# Patient Record
Sex: Male | Born: 1985 | Race: White | Hispanic: No | Smoking: Former smoker
Health system: Southern US, Community
[De-identification: ages and names within clinical notes are randomized; demographics above are authoritative.]

## PROBLEM LIST (undated history)

## (undated) DIAGNOSIS — F419 Anxiety disorder, unspecified: Secondary | ICD-10-CM

## (undated) DIAGNOSIS — B019 Varicella without complication: Secondary | ICD-10-CM

## (undated) DIAGNOSIS — F32A Depression, unspecified: Secondary | ICD-10-CM

## (undated) DIAGNOSIS — K859 Acute pancreatitis without necrosis or infection, unspecified: Secondary | ICD-10-CM

## (undated) DIAGNOSIS — I10 Essential (primary) hypertension: Secondary | ICD-10-CM

## (undated) DIAGNOSIS — F1920 Other psychoactive substance dependence, uncomplicated: Secondary | ICD-10-CM

## (undated) DIAGNOSIS — I639 Cerebral infarction, unspecified: Secondary | ICD-10-CM

## (undated) DIAGNOSIS — F329 Major depressive disorder, single episode, unspecified: Secondary | ICD-10-CM

## (undated) HISTORY — DX: Major depressive disorder, single episode, unspecified: F32.9

## (undated) HISTORY — DX: Anxiety disorder, unspecified: F41.9

## (undated) HISTORY — DX: Cerebral infarction, unspecified: I63.9

## (undated) HISTORY — DX: Other psychoactive substance dependence, uncomplicated: F19.20

## (undated) HISTORY — DX: Acute pancreatitis without necrosis or infection, unspecified: K85.90

## (undated) HISTORY — DX: Essential (primary) hypertension: I10

## (undated) HISTORY — DX: Depression, unspecified: F32.A

## (undated) HISTORY — DX: Varicella without complication: B01.9

---

## 2007-12-04 HISTORY — PX: TONSILLECTOMY: SUR1361

## 2012-02-19 ENCOUNTER — Other Ambulatory Visit: Payer: Self-pay

## 2012-02-19 ENCOUNTER — Encounter (HOSPITAL_COMMUNITY): Payer: Self-pay | Admitting: Emergency Medicine

## 2012-02-19 ENCOUNTER — Encounter (HOSPITAL_COMMUNITY): Payer: Self-pay | Admitting: *Deleted

## 2012-02-19 ENCOUNTER — Emergency Department (HOSPITAL_COMMUNITY)
Admission: EM | Admit: 2012-02-19 | Discharge: 2012-02-19 | Disposition: A | Discharge status: Home / Self Care | Payer: BC Managed Care – PPO | Attending: Emergency Medicine | Admitting: Emergency Medicine

## 2012-02-19 DIAGNOSIS — R631 Polydipsia: Secondary | ICD-10-CM | POA: Insufficient documentation

## 2012-02-19 DIAGNOSIS — R42 Dizziness and giddiness: Secondary | ICD-10-CM

## 2012-02-19 DIAGNOSIS — R209 Unspecified disturbances of skin sensation: Secondary | ICD-10-CM | POA: Insufficient documentation

## 2012-02-19 DIAGNOSIS — R3589 Other polyuria: Secondary | ICD-10-CM | POA: Insufficient documentation

## 2012-02-19 DIAGNOSIS — R55 Syncope and collapse: Secondary | ICD-10-CM | POA: Insufficient documentation

## 2012-02-19 DIAGNOSIS — R002 Palpitations: Secondary | ICD-10-CM

## 2012-02-19 DIAGNOSIS — R110 Nausea: Secondary | ICD-10-CM | POA: Insufficient documentation

## 2012-02-19 DIAGNOSIS — R5383 Other fatigue: Secondary | ICD-10-CM | POA: Insufficient documentation

## 2012-02-19 DIAGNOSIS — R358 Other polyuria: Secondary | ICD-10-CM | POA: Insufficient documentation

## 2012-02-19 DIAGNOSIS — H538 Other visual disturbances: Secondary | ICD-10-CM | POA: Insufficient documentation

## 2012-02-19 DIAGNOSIS — R5381 Other malaise: Secondary | ICD-10-CM | POA: Insufficient documentation

## 2012-02-19 DIAGNOSIS — R61 Generalized hyperhidrosis: Secondary | ICD-10-CM | POA: Insufficient documentation

## 2012-02-19 LAB — URINE MICROSCOPIC-ADD ON

## 2012-02-19 LAB — CBC
Hemoglobin: 15.8 g/dL (ref 13.0–17.0)
MCHC: 35.4 g/dL (ref 30.0–36.0)
RBC: 5.3 MIL/uL (ref 4.22–5.81)
WBC: 8.5 10*3/uL (ref 4.0–10.5)

## 2012-02-19 LAB — DIFFERENTIAL
Basophils Relative: 0 % (ref 0–1)
Monocytes Relative: 6 % (ref 3–12)
Neutro Abs: 5.9 10*3/uL (ref 1.7–7.7)
Neutrophils Relative %: 70 % (ref 43–77)

## 2012-02-19 LAB — BASIC METABOLIC PANEL
Chloride: 103 mEq/L (ref 96–112)
GFR calc Af Amer: >90 mL/min (ref >90)
Potassium: 4.2 mEq/L (ref 3.5–5.1)

## 2012-02-19 LAB — RAPID URINE DRUG SCREEN, HOSP PERFORMED
Barbiturates: NOT DETECTED
Benzodiazepines: NOT DETECTED
Cocaine: NOT DETECTED

## 2012-02-19 LAB — URINALYSIS, ROUTINE W REFLEX MICROSCOPIC
Bilirubin Urine: NEGATIVE
Glucose, UA: NEGATIVE mg/dL
Ketones, ur: NEGATIVE mg/dL
Nitrite: NEGATIVE
pH: 7 (ref 5.0–8.0)

## 2012-02-19 LAB — GLUCOSE, CAPILLARY: Glucose-Capillary: 96 mg/dL (ref 70–99)

## 2012-02-19 NOTE — ED Notes (Signed)
MD at bedside. 

## 2012-02-19 NOTE — ED Notes (Signed)
had a kinda syncopal episode on Friday  Got cold and sweaty dizzy, has had this  Happen again so he is here for tx has been really thirsty

## 2012-02-19 NOTE — Discharge Instructions (Signed)
RESOURCE GUIDE  Dental Problems  Patients with Medicaid: [facility]                     [facility] [address]                                           [address] Phone:  [phone]                                                  Phone:  [phone]  If unable to pay or uninsured, contact:  [facility] or [facility] to become qualified for the adult dental clinic.  Chronic Pain Problems Contact [facility]  [phone] Patients need to be referred by their primary care doctor.  Insufficient Money for Medicine Contact [employer]:  call "211" or [facility] 271-5999.  No Primary Care Doctor Call [facility]  832-8000 Other agencies that provide inexpensive medical care    [facility]  [phone]    [facility]  [phone]    [facility]  [phone]    Women[facility] Clinic  [phone]    [facility]  [phone]    [facility]  [phone]  Psychological Services [facility]  [phone] [facility]  [phone] [facility]   [phone] (emergency services 641-4993)  Substance Abuse Resources [facility]  336-882-2125 [facility] 336-784-9470 [facility] 336-285-9073 [facility] 336-845-3988 Residential & Outpatient Substance Abuse Program  800-659-3381  Abuse/Neglect [place] Child Abuse Hotline (336) 641-3795 [place] [facility] Abuse Hotline 800-378-5315 (After Hours)  Emergency Shelter [facility] (336) 271-5985  M[facility] Homes [facility] (336) 275-9566 [facility] (704) 372-4663  MRSA Hotline #:   832-7006    [place] [facility]  [facility] [facility]     [employer]                          [facility] [address] [place]                       [address]      [address]  [place]                                                [place]                            [place] Phone:  [phone]                                   Phone:  [phone]                 Phone:  [phone]  [facility] Phone:  [phone]    [facility] Child Abuse Hotline (203)856-8190 (580)471-4542 (After Hours)     Anxiety and Panic Attacks Your caregiver has informed you that you are having an anxiety or panic attack. There may be many forms of this. Most of the time these attacks come suddenly and without warning. They come at any time of day, including periods of sleep, and at any time of life. They may be strong and unexplained. Although panic attacks are very scary, they are physically harmless. Sometimes the cause of your anxiety is not known. Anxiety is a protective mechanism of the body in its fight or flight mechanism. Most of these perceived danger situations are actually nonphysical situations (such as anxiety over losing a job). CAUSES  The causes of an anxiety or panic attack are many. Panic attacks may occur in otherwise healthy people given a certain set of circumstances. There may be a genetic cause for panic attacks. Some medications may also have anxiety as a side effect. SYMPTOMS  Some of the most common feelings are:  Intense terror.   Dizziness, feeling faint.   Hot and cold flashes.   Fear of going crazy.   Feelings that nothing is real.   Sweating.   Shaking.   Chest pain or a fast heartbeat (palpitations).   Smothering, choking sensations.   Feelings of impending doom and that death is near.   Tingling of extremities, this may be from over-breathing.   Altered reality (derealization).   Being detached from yourself (depersonalization).  Several symptoms can be present to make up anxiety or panic attacks. DIAGNOSIS  The evaluation by your caregiver will depend on the type of symptoms you are  experiencing. The diagnosis of anxiety or panic attack is made when no physical illness can be determined to be a cause of the symptoms. TREATMENT  Treatment to prevent anxiety and panic attacks may include:  Avoidance of circumstances that cause anxiety.   Reassurance and relaxation.   Regular exercise.   Relaxation therapies, such as yoga.   Psychotherapy with a psychiatrist or therapist.   Avoidance of caffeine, alcohol and illegal drugs.   Prescribed medication.  SEEK IMMEDIATE MEDICAL CARE IF:   You experience panic attack symptoms that are different than your usual symptoms.   You have any worsening or concerning symptoms.  Document Released: 11/19/2005 Document Revised: 11/08/2011 Document Reviewed: 03/23/2010 [facility] Patient Information 2012 [place], [place].

## 2012-02-19 NOTE — ED Provider Notes (Signed)
History     CSN: [id number]  Arrival date & time 02/19/12  4098   First MD Initiated Contact with Patient 02/19/12 [phone]      Chief Complaint  Patient presents with  . Weakness    (Consider location/radiation/quality/duration/timing/severity/associated sxs/prior treatment) HPI The patient is a 26 yo man, no significant past medical history, presenting with episodes of dizziness, LH, and palpitations.  Four days ago, the patient was at work when he experienced an episode of LH, dizziness, palpitations, presyncope, blurry vision, and a cold sweat.  The symptoms resolved after a few hours at rest.  The patient had similar episodes 1 day ago and this morning, prompting him to seek care.  The patient also notes significant polyuria/polydipsia for the last week, feeling constantly thirsty, drinking > 4L of water during the day and urinating frequently.  He notes no dysuria or suprapubic pain.  He notes some nausea, but no vomiting, abd pain, diarrhea, or constipation.  He believes he may have felt similar symptoms once a couple of years ago.  History reviewed. No pertinent past medical history.  Past Surgical History  Procedure Date  . Tonsillectomy     No family history on file.  History  Substance Use Topics  . Smoking status: Current Everyday Smoker  . Smokeless tobacco: Not on file  . Alcohol Use: Yes      Review of Systems General: no fevers, chills, changes in weight, changes in appetite Skin: no rash HEENT: no hearing changes, sore throat Pulm: no dyspnea, coughing, wheezing CV: no chest pain, shortness of breath Abd: no abdominal pain, vomiting, diarrhea/constipation GU: no dysuria, hematuria Ext: no arthralgias, myalgias Neuro: +mild whole-body "tingling" during episodes  Allergies  Penicillins  Home Medications  No current outpatient prescriptions on file.  BP 148/92  Pulse 82  Temp(Src) 97.5 F (36.4 C) (Oral)  Resp 18  SpO2 98%  Physical Exam General:  alert, cooperative, appears mildly anxious HEENT: pupils equal round and reactive to light, EOMI, visual acuity 20/30 bilaterally, visual fields intact, dix-halpike maneuver produced minimal dizziness and no nystagmus, oropharynx clear and non-erythematous  Neck: supple, no lymphadenopathy, no JVD Lungs: clear to ascultation bilaterally, normal work of respiration, no wheezes, rales, ronchi Heart: regular rate and rhythm, no murmurs, gallops, or rubs Abdomen: soft, non-tender, non-distended, normal bowel sounds Extremities: no cyanosis, clubbing, or edema Neurologic: alert & oriented X3, cranial nerves II-XII intact, strength grossly intact, sensation intact to light touch  ED Course  Procedures (including critical care time)   Labs Reviewed  GLUCOSE, CAPILLARY  CBC  DIFFERENTIAL  URINE RAPID DRUG SCREEN (HOSP PERFORMED)  URINALYSIS, ROUTINE W REFLEX MICROSCOPIC  BASIC METABOLIC PANEL   No results found.   No diagnosis found.  EKG: NSR, no ST abnormalities, some J-point elevation in V3-V6  MDM  The patient is a 26 yo man, presenting with 3 episodes of LH, dizziness, palpitations, and blurry vision, also with polyuria/polydipsia.  # Palpitations/LH/blurry vision - symptoms may represent paroxysmal afib, though patient is currently in NSR.  Differential also includes hypoglycemia vs hyponatremia vs vertigo (though no nystagmus, only mild dizziness with dix-halpike) vs toxin-induced vs infection vs panic attack. -cbc, cmp -UA, urine tox screen -EKG normal  # Polyuria - unclear etiology, consider primary polydipsia vs diabetes insipidus.  DM unlikely given fasting blood glucose = 96. -cmp, UA -likely warrants further w/u by PCP  # Dispo - pending labs, likely d/c home with PCP follow-up   Linward Headland, MD 02/19/12  1018   12:22 pm - patient feels somewhat better.  Labs showed no anemia, leukocytosis, or electrolyte abnormality.  Urine tox screen negative.  Symptoms likely  represent paroxysmal atrial fibrillation vs panic attack.  Recommended close f/u with a PCP, and a list of PCP's were provided to patient.  Will discharge to home.  Linward Headland, MD 02/19/12 [phone]

## 2012-02-19 NOTE — ED Provider Notes (Signed)
I saw and evaluated the patient, reviewed the resident's note and I agree with the findings and plan.    Date: 02/19/2012  Rate: 69  Rhythm: normal sinus rhythm  QRS Axis: normal  Intervals: normal  ST/T Wave abnormalities: normal  Conduction Disutrbances:none  Narrative Interpretation:   Old EKG Reviewed: none available.   25yo M, c/o several near syncopal episodes of the past several days.  Has hx of same several years ago but unsure what dx was.  Describes as "dizzy," lightheaded, palpitations, "cold sweat."  Seem to occur when he is at work.  VSS, not orthostatic, CTA, RRR, neuro non-focal.  EKG, labs normal.  CBG also normal.  Pt reassured, D/C and f/u with PMD.        Laray Anger, DO 02/21/12 1323

## 2012-02-21 ENCOUNTER — Other Ambulatory Visit: Payer: Self-pay | Admitting: Family Medicine

## 2012-02-21 ENCOUNTER — Ambulatory Visit
Admission: RE | Admit: 2012-02-21 | Discharge: 2012-02-21 | Disposition: A | Discharge status: Home / Self Care | Payer: BC Managed Care – PPO | Source: Ambulatory Visit | Attending: Family Medicine | Admitting: Family Medicine

## 2012-02-21 DIAGNOSIS — R05 Cough: Secondary | ICD-10-CM

## 2015-12-23 ENCOUNTER — Ambulatory Visit (INDEPENDENT_AMBULATORY_CARE_PROVIDER_SITE_OTHER): Payer: BLUE CROSS/BLUE SHIELD | Admitting: Adult Health

## 2015-12-23 ENCOUNTER — Encounter: Payer: Self-pay | Admitting: Adult Health

## 2015-12-23 VITALS — BP 138/100 | Temp 99.0°F | Ht 73.0 in | Wt 215.0 lb

## 2015-12-23 DIAGNOSIS — Z7689 Persons encountering health services in other specified circumstances: Secondary | ICD-10-CM

## 2015-12-23 DIAGNOSIS — I10 Essential (primary) hypertension: Secondary | ICD-10-CM | POA: Insufficient documentation

## 2015-12-23 DIAGNOSIS — Z7189 Other specified counseling: Secondary | ICD-10-CM

## 2015-12-23 NOTE — Progress Notes (Signed)
HPI:  Antonio Rogers is here to establish care. He is a very pleasant caucasian male who  has a past medical history of Drug abuse and dependence ([place]); Chicken pox; Depression; Hypertension; and Anxiety.  Last PCP and physical: 10 years ago  Has the following chronic problems that require follow up and concerns today:  Hypertension - He feels as though his blood pressure elevation is more the result of anxiety. He has noticed that once his anxiety is under control, his blood pressures come down.   Tobacco Use - He smokes about a pack a day. His brother ( 43 years old) recently died from a drug overdose. He knows he needs to quit but does not want to at this time.   ROS negative for unless reported above: fevers, chills,feeling poorly, unintentional weight loss, hearing or vision loss, chest pain, palpitations, leg claudication, struggling to breath,Not feeling congested in the chest, no orthopenia, no cough,no wheezing, normal appetite, no soft tissue swelling, no hemoptysis, melena, hematochezia, hematuria, falls, loc, si, or thoughts of self harm.  Immunizations: Diet: Tries to eat healthy  Exercise: Uses treadmill 30 minutes a day 3-4 days a week.   He follows up with psychiatry - Sees them once every other month.    Past Medical History  Diagnosis Date  . Drug abuse and dependence ([place])     cocaine   . Chicken pox   . Depression   . Hypertension     high blood pressure readings  . Anxiety     Past Surgical History  Procedure Laterality Date  . Tonsillectomy  2009    History reviewed. No pertinent family history.  Social History   Social History  . Marital Status: Married    Spouse Name: N/A  . Number of Children: N/A  . Years of Education: N/A   Social History Main Topics  . Smoking status: Current Every Day Smoker -- 1.00 packs/day    Types: Cigarettes  . Smokeless tobacco: None  . Alcohol Use: 0.0 oz/week    0 Standard drinks or equivalent per week     Comment: daily; 1 to 2 glasses of wine a night   . Drug Use: None  . Sexual Activity: Not Asked   Other Topics Concern  . None   Social History Narrative     Current outpatient prescriptions:  .  hydrOXYzine (ATARAX/VISTARIL) 50 MG tablet, TAKE 1/2-1 TABLET BY MOUTH EVERY 6 HOURS AS NEEDED FOR ANXIETY, Disp: , Rfl: 2 .  traZODone (DESYREL) 50 MG tablet, Take 150 mg by mouth at bedtime. , Disp: , Rfl: 0 .  venlafaxine XR (EFFEXOR-XR) 37.5 MG 24 hr capsule, TAKE 1 CAPSULE BY MOUTH DAILY FOR MOOD., Disp: , Rfl: 0  EXAM:  Filed Vitals:   12/23/15 1305  BP: 138/100  Temp: 99 F (37.2 C)    Body mass index is 28.37 kg/(m^2).  GENERAL: vitals reviewed and listed above, alert, oriented, appears well hydrated and in no acute distress  HEENT: atraumatic, conjunttiva clear, no obvious abnormalities on inspection of external nose and ears  NECK: Neck is soft and supple without masses, no adenopathy or thyromegaly, trachea midline, no JVD. Normal range of motion.   LUNGS: clear to auscultation bilaterally, no wheezes, rales or rhonchi, good air movement  CV: Regular rate and rhythm, normal S1/S2, no audible murmurs, gallops, or rubs. No carotid bruit and no peripheral edema.   MS: moves all extremities without noticeable abnormality. No edema noted  Abd: soft/nontender/nondistended/normal  bowel sounds   Skin: warm and dry, no rash   Extremities: No clubbing, cyanosis, or edema. Capillary refill is WNL. Pulses intact bilaterally in upper and lower extremities.   Neuro: CN II-XII intact, sensation and reflexes normal throughout, 5/5 muscle strength in bilateral upper and lower extremities. Normal finger to nose. Normal rapid alternating movements. Normal romberg. No pronator drift.   PSYCH: pleasant and cooperative, no obvious depression or anxiety  ASSESSMENT AND PLAN:  1. Essential hypertension - Work on diet and exercise. We will explore this more at his physical. He does  not want to start any medication at this time. Would like to get his anxiety under control and work on diet and exercise to see if it reduces.   2. Encounter to establish care - Follow up for CPE - Follow up sooner if needed - Work on diet and exercise - Quit smoking   -We reviewed the PMH, PSH, FH, SH, Meds and Allergies. -We provided refills for any medications we will prescribe as needed. -We addressed current concerns per orders and patient instructions. -We have asked for records for pertinent exams, studies, vaccines and notes from previous providers. -We have advised patient to follow up per instructions below.   -Patient advised to return or notify a provider immediately if symptoms worsen or persist or new concerns arise.    Dorothyann Peng, AGNP

## 2015-12-23 NOTE — Patient Instructions (Addendum)
It was great meeting you today!  Please follow up with me for your physical. If you need anything in the meantime, please let me know.   Start working on cutting down on the amount of cigarettes you smoke. Start exercising and eating healthy.   Health Maintenance, Male A healthy lifestyle and preventative care can promote health and wellness.  Maintain regular health, dental, and eye exams.  Eat a healthy diet. Foods like vegetables, fruits, whole grains, low-fat dairy products, and lean protein foods contain the nutrients you need and are low in calories. Decrease your intake of foods high in solid fats, added sugars, and salt. Get information about a proper diet from your health care provider, if necessary.  Regular physical exercise is one of the most important things you can do for your health. Most adults should get at least 150 minutes of moderate-intensity exercise (any activity that increases your heart rate and causes you to sweat) each week. In addition, most adults need muscle-strengthening exercises on 2 or more days a week.   Maintain a healthy weight. The body mass index (BMI) is a screening tool to identify possible weight problems. It provides an estimate of body fat based on height and weight. Your health care provider can find your BMI and can help you achieve or maintain a healthy weight. For males 20 years and older:  A BMI below 18.5 is considered underweight.  A BMI of 18.5 to 24.9 is normal.  A BMI of 25 to 29.9 is considered overweight.  A BMI of 30 and above is considered obese.  Maintain normal blood lipids and cholesterol by exercising and minimizing your intake of saturated fat. Eat a balanced diet with plenty of fruits and vegetables. Blood tests for lipids and cholesterol should begin at age 11 and be repeated every 5 years. If your lipid or cholesterol levels are high, you are over age 14, or you are at high risk for heart disease, you may need your  cholesterol levels checked more frequently.Ongoing high lipid and cholesterol levels should be treated with medicines if diet and exercise are not working.  If you smoke, find out from your health care provider how to quit. If you do not use tobacco, do not start.  Lung cancer screening is recommended for adults aged 78-80 years who are at high risk for developing lung cancer because of a history of smoking. A yearly low-dose CT scan of the lungs is recommended for people who have at least a 30-pack-year history of smoking and are current smokers or have quit within the past 15 years. A pack year of smoking is smoking an average of 1 pack of cigarettes a day for 1 year (for example, a 30-pack-year history of smoking could mean smoking 1 pack a day for 30 years or 2 packs a day for 15 years). Yearly screening should continue until the smoker has stopped smoking for at least 15 years. Yearly screening should be stopped for people who develop a health problem that would prevent them from having lung cancer treatment.  If you choose to drink alcohol, do not have more than 2 drinks per day. One drink is considered to be 12 oz (360 mL) of beer, 5 oz (150 mL) of wine, or 1.5 oz (45 mL) of liquor.  Avoid the use of street drugs. Do not share needles with anyone. Ask for help if you need support or instructions about stopping the use of drugs.  High blood pressure causes  heart disease and increases the risk of stroke. High blood pressure is more likely to develop in:  People who have blood pressure in the end of the normal range (100-139/85-89 mm Hg).  People who are overweight or obese.  People who are African American.  If you are 51-78 years of age, have your blood pressure checked every 3-5 years. If you are 48 years of age or older, have your blood pressure checked every year. You should have your blood pressure measured twice--once when you are at a hospital or clinic, and once when you are not at a  hospital or clinic. Record the average of the two measurements. To check your blood pressure when you are not at a hospital or clinic, you can use:  An automated blood pressure machine at a pharmacy.  A home blood pressure monitor.  If you are 62-2 years old, ask your health care provider if you should take aspirin to prevent heart disease.  Diabetes screening involves taking a blood sample to check your fasting blood sugar level. This should be done once every 3 years after age 2 if you are at a normal weight and without risk factors for diabetes. Testing should be considered at a younger age or be carried out more frequently if you are overweight and have at least 1 risk factor for diabetes.  Colorectal cancer can be detected and often prevented. Most routine colorectal cancer screening begins at the age of 34 and continues through age 1. However, your health care provider may recommend screening at an earlier age if you have risk factors for colon cancer. On a yearly basis, your health care provider may provide home test kits to check for hidden blood in the stool. A small camera at the end of a tube may be used to directly examine the colon (sigmoidoscopy or colonoscopy) to detect the earliest forms of colorectal cancer. Talk to your health care provider about this at age 60 when routine screening begins. A direct exam of the colon should be repeated every 5-10 years through age 35, unless early forms of precancerous polyps or small growths are found.  People who are at an increased risk for hepatitis B should be screened for this virus. You are considered at high risk for hepatitis B if:  You were born in a country where hepatitis B occurs often. Talk with your health care provider about which countries are considered high risk.  Your parents were born in a high-risk country and you have not received a shot to protect against hepatitis B (hepatitis B vaccine).  You have HIV or AIDS.  You  use needles to inject street drugs.  You live with, or have sex with, someone who has hepatitis B.  You are a man who has sex with other men (MSM).  You get hemodialysis treatment.  You take certain medicines for conditions like cancer, organ transplantation, and autoimmune conditions.  Hepatitis C blood testing is recommended for all people born from [birth year] through 1967 and any individual with known risk factors for hepatitis C.  Healthy men should no longer receive prostate-specific antigen (PSA) blood tests as part of routine cancer screening. Talk to your health care provider about prostate cancer screening.  Testicular cancer screening is not recommended for adolescents or adult males who have no symptoms. Screening includes self-exam, a health care provider exam, and other screening tests. Consult with your health care provider about any symptoms you have or any concerns you have about  testicular cancer.  Practice safe sex. Use condoms and avoid high-risk sexual practices to reduce the spread of sexually transmitted infections (STIs).  You should be screened for STIs, including gonorrhea and chlamydia if:  You are sexually active and are younger than 24 years.  You are older than 24 years, and your health care provider tells you that you are at risk for this type of infection.  Your sexual activity has changed since you were last screened, and you are at an increased risk for chlamydia or gonorrhea. Ask your health care provider if you are at risk.  If you are at risk of being infected with HIV, it is recommended that you take a prescription medicine daily to prevent HIV infection. This is called pre-exposure prophylaxis (PrEP). You are considered at risk if:  You are a man who has sex with other men (MSM).  You are a heterosexual man who is sexually active with multiple partners.  You take drugs by injection.  You are sexually active with a partner who has HIV.  Talk with  your health care provider about whether you are at high risk of being infected with HIV. If you choose to begin PrEP, you should first be tested for HIV. You should then be tested every 3 months for as long as you are taking PrEP.  Use sunscreen. Apply sunscreen liberally and repeatedly throughout the day. You should seek shade when your shadow is shorter than you. Protect yourself by wearing long sleeves, pants, a wide-brimmed hat, and sunglasses year round whenever you are outdoors.  Tell your health care provider of new moles or changes in moles, especially if there is a change in shape or color. Also, tell your health care provider if a mole is larger than the size of a pencil eraser.  A one-time screening for abdominal aortic aneurysm (AAA) and surgical repair of large AAAs by ultrasound is recommended for men aged 34-75 years who are current or former smokers.  Stay current with your vaccines (immunizations).   This information is not intended to replace advice given to you by your health care provider. Make sure you discuss any questions you have with your health care provider.   Document Released: 05/17/2008 Document Revised: 12/10/2014 Document Reviewed: 04/16/2011 Elsevier Interactive Patient Education [employer].

## 2015-12-25 ENCOUNTER — Encounter: Payer: Self-pay | Admitting: Adult Health

## 2016-02-13 ENCOUNTER — Other Ambulatory Visit: Payer: BLUE CROSS/BLUE SHIELD

## 2016-02-15 ENCOUNTER — Other Ambulatory Visit (INDEPENDENT_AMBULATORY_CARE_PROVIDER_SITE_OTHER): Payer: BLUE CROSS/BLUE SHIELD

## 2016-02-15 DIAGNOSIS — R7989 Other specified abnormal findings of blood chemistry: Secondary | ICD-10-CM

## 2016-02-15 DIAGNOSIS — Z0000 Encounter for general adult medical examination without abnormal findings: Secondary | ICD-10-CM

## 2016-02-15 LAB — HEPATIC FUNCTION PANEL
ALBUMIN: 4.4 g/dL (ref 3.5–5.2)
ALT: 36 U/L (ref 0–53)
AST: 28 U/L (ref 0–37)
Alkaline Phosphatase: 68 U/L (ref 39–117)
Bilirubin, Direct: 0.1 mg/dL (ref 0.0–0.3)
TOTAL PROTEIN: 6.9 g/dL (ref 6.0–8.3)
Total Bilirubin: 0.7 mg/dL (ref 0.2–1.2)

## 2016-02-15 LAB — CBC WITH DIFFERENTIAL/PLATELET
BASOS PCT: 0.2 % (ref 0.0–3.0)
Basophils Absolute: 0 10*3/uL (ref 0.0–0.1)
EOS PCT: 1.8 % (ref 0.0–5.0)
Eosinophils Absolute: 0.1 10*3/uL (ref 0.0–0.7)
HEMATOCRIT: 47.5 % (ref 39.0–52.0)
HEMOGLOBIN: 16.5 g/dL (ref 13.0–17.0)
Lymphocytes Relative: 30 % (ref 12.0–46.0)
Lymphs Abs: 2.1 10*3/uL (ref 0.7–4.0)
MCHC: 34.6 g/dL (ref 30.0–36.0)
MCV: 85.7 fl (ref 78.0–100.0)
MONO ABS: 0.5 10*3/uL (ref 0.1–1.0)
MONOS PCT: 6.8 % (ref 3.0–12.0)
Neutro Abs: 4.3 10*3/uL (ref 1.4–7.7)
Neutrophils Relative %: 61.2 % (ref 43.0–77.0)
Platelets: 250 10*3/uL (ref 150.0–400.0)
RBC: 5.54 Mil/uL (ref 4.22–5.81)
RDW: 13 % (ref 11.5–15.5)
WBC: 7.1 10*3/uL (ref 4.0–10.5)

## 2016-02-15 LAB — POC URINALSYSI DIPSTICK (AUTOMATED)
BILIRUBIN UA: NEGATIVE
Blood, UA: NEGATIVE
GLUCOSE UA: NEGATIVE
KETONES UA: NEGATIVE
LEUKOCYTES UA: NEGATIVE
NITRITE UA: NEGATIVE
PH UA: 8
Protein, UA: NEGATIVE
Spec Grav, UA: 1.02
Urobilinogen, UA: 0.2

## 2016-02-15 LAB — BASIC METABOLIC PANEL
BUN: 13 mg/dL (ref 6–23)
CHLORIDE: 102 meq/L (ref 96–112)
CO2: 25 mEq/L (ref 19–32)
Calcium: 9.8 mg/dL (ref 8.4–10.5)
Creatinine, Ser: 0.97 mg/dL (ref 0.40–1.50)
GFR: 95.67 mL/min (ref >60.00)
Glucose, Bld: 95 mg/dL (ref 70–99)
POTASSIUM: 4.1 meq/L (ref 3.5–5.1)
SODIUM: 138 meq/L (ref 135–145)

## 2016-02-15 LAB — LIPID PANEL
CHOLESTEROL: 214 mg/dL — AB (ref 0–200)
HDL: 39.8 mg/dL (ref >39.00)
Total CHOL/HDL Ratio: 5

## 2016-02-15 LAB — LDL CHOLESTEROL, DIRECT: Direct LDL: 103 mg/dL

## 2016-02-15 LAB — TSH: TSH: 1.56 u[IU]/mL (ref 0.35–4.50)

## 2016-02-20 ENCOUNTER — Encounter: Payer: BLUE CROSS/BLUE SHIELD | Admitting: Adult Health

## 2016-04-24 ENCOUNTER — Encounter: Payer: Self-pay | Admitting: Adult Health

## 2016-04-24 ENCOUNTER — Ambulatory Visit (INDEPENDENT_AMBULATORY_CARE_PROVIDER_SITE_OTHER): Payer: BLUE CROSS/BLUE SHIELD | Admitting: Adult Health

## 2016-04-24 VITALS — BP 160/98 | Temp 97.8°F | Ht 73.0 in | Wt 217.4 lb

## 2016-04-24 DIAGNOSIS — E785 Hyperlipidemia, unspecified: Secondary | ICD-10-CM | POA: Insufficient documentation

## 2016-04-24 DIAGNOSIS — I10 Essential (primary) hypertension: Secondary | ICD-10-CM | POA: Diagnosis not present

## 2016-04-24 DIAGNOSIS — Z0000 Encounter for general adult medical examination without abnormal findings: Secondary | ICD-10-CM

## 2016-04-24 MED ORDER — LISINOPRIL 20 MG PO TABS: 20.0000 mg | ORAL_TABLET | Freq: Every day | ORAL | Status: DC

## 2016-04-24 NOTE — Patient Instructions (Addendum)
It was great seeing you again!  As discussed, your triglyceride level is high. Diet and exercise will fix this so we dont have to go on medications.   I have sent in a prescription for lisinopril, this will help bring down your blood pressure.  Follow up in three months   Send me your blood pressure readings in the next week  Health Maintenance, Male A healthy lifestyle and preventative care can promote health and wellness.  Maintain regular health, dental, and eye exams.  Eat a healthy diet. Foods like vegetables, fruits, whole grains, low-fat dairy products, and lean protein foods contain the nutrients you need and are low in calories. Decrease your intake of foods high in solid fats, added sugars, and salt. Get information about a proper diet from your health care provider, if necessary.  Regular physical exercise is one of the most important things you can do for your health. Most adults should get at least 150 minutes of moderate-intensity exercise (any activity that increases your heart rate and causes you to sweat) each week. In addition, most adults need muscle-strengthening exercises on 2 or more days a week.   Maintain a healthy weight. The body mass index (BMI) is a screening tool to identify possible weight problems. It provides an estimate of body fat based on height and weight. Your health care provider can find your BMI and can help you achieve or maintain a healthy weight. For males 20 years and older:  A BMI below 18.5 is considered underweight.  A BMI of 18.5 to 24.9 is normal.  A BMI of 25 to 29.9 is considered overweight.  A BMI of 30 and above is considered obese.  Maintain normal blood lipids and cholesterol by exercising and minimizing your intake of saturated fat. Eat a balanced diet with plenty of fruits and vegetables. Blood tests for lipids and cholesterol should begin at age 65 and be repeated every 5 years. If your lipid or cholesterol levels are high, you  are over age 11, or you are at high risk for heart disease, you may need your cholesterol levels checked more frequently.Ongoing high lipid and cholesterol levels should be treated with medicines if diet and exercise are not working.  If you smoke, find out from your health care provider how to quit. If you do not use tobacco, do not start.  Lung cancer screening is recommended for adults aged 97-80 years who are at high risk for developing lung cancer because of a history of smoking. A yearly low-dose CT scan of the lungs is recommended for people who have at least a 30-pack-year history of smoking and are current smokers or have quit within the past 15 years. A pack year of smoking is smoking an average of 1 pack of cigarettes a day for 1 year (for example, a 30-pack-year history of smoking could mean smoking 1 pack a day for 30 years or 2 packs a day for 15 years). Yearly screening should continue until the smoker has stopped smoking for at least 15 years. Yearly screening should be stopped for people who develop a health problem that would prevent them from having lung cancer treatment.  If you choose to drink alcohol, do not have more than 2 drinks per day. One drink is considered to be 12 oz (360 mL) of beer, 5 oz (150 mL) of wine, or 1.5 oz (45 mL) of liquor.  Avoid the use of street drugs. Do not share needles with anyone. Ask for  help if you need support or instructions about stopping the use of drugs.  High blood pressure causes heart disease and increases the risk of stroke. High blood pressure is more likely to develop in:  People who have blood pressure in the end of the normal range (100-139/85-89 mm Hg).  People who are overweight or obese.  People who are African American.  If you are 81-13 years of age, have your blood pressure checked every 3-5 years. If you are 43 years of age or older, have your blood pressure checked every year. You should have your blood pressure measured  twice--once when you are at a hospital or clinic, and once when you are not at a hospital or clinic. Record the average of the two measurements. To check your blood pressure when you are not at a hospital or clinic, you can use:  An automated blood pressure machine at a pharmacy.  A home blood pressure monitor.  If you are 30-79 years old, ask your health care provider if you should take aspirin to prevent heart disease.  Diabetes screening involves taking a blood sample to check your fasting blood sugar level. This should be done once every 3 years after age 60 if you are at a normal weight and without risk factors for diabetes. Testing should be considered at a younger age or be carried out more frequently if you are overweight and have at least 1 risk factor for diabetes.  Colorectal cancer can be detected and often prevented. Most routine colorectal cancer screening begins at the age of 19 and continues through age 76. However, your health care provider may recommend screening at an earlier age if you have risk factors for colon cancer. On a yearly basis, your health care provider may provide home test kits to check for hidden blood in the stool. A small camera at the end of a tube may be used to directly examine the colon (sigmoidoscopy or colonoscopy) to detect the earliest forms of colorectal cancer. Talk to your health care provider about this at age 68 when routine screening begins. A direct exam of the colon should be repeated every 5-10 years through age 15, unless early forms of precancerous polyps or small growths are found.  People who are at an increased risk for hepatitis B should be screened for this virus. You are considered at high risk for hepatitis B if:  You were born in a country where hepatitis B occurs often. Talk with your health care provider about which countries are considered high risk.  Your parents were born in a high-risk country and you have not received a shot to  protect against hepatitis B (hepatitis B vaccine).  You have HIV or AIDS.  You use needles to inject street drugs.  You live with, or have sex with, someone who has hepatitis B.  You are a man who has sex with other men (MSM).  You get hemodialysis treatment.  You take certain medicines for conditions like cancer, organ transplantation, and autoimmune conditions.  Hepatitis C blood testing is recommended for all people born from [birth year] through 1967 and any individual with known risk factors for hepatitis C.  Healthy men should no longer receive prostate-specific antigen (PSA) blood tests as part of routine cancer screening. Talk to your health care provider about prostate cancer screening.  Testicular cancer screening is not recommended for adolescents or adult males who have no symptoms. Screening includes self-exam, a health care provider exam, and other screening  tests. Consult with your health care provider about any symptoms you have or any concerns you have about testicular cancer.  Practice safe sex. Use condoms and avoid high-risk sexual practices to reduce the spread of sexually transmitted infections (STIs).  You should be screened for STIs, including gonorrhea and chlamydia if:  You are sexually active and are younger than 24 years.  You are older than 24 years, and your health care provider tells you that you are at risk for this type of infection.  Your sexual activity has changed since you were last screened, and you are at an increased risk for chlamydia or gonorrhea. Ask your health care provider if you are at risk.  If you are at risk of being infected with HIV, it is recommended that you take a prescription medicine daily to prevent HIV infection. This is called pre-exposure prophylaxis (PrEP). You are considered at risk if:  You are a man who has sex with other men (MSM).  You are a heterosexual man who is sexually active with multiple partners.  You take drugs by  injection.  You are sexually active with a partner who has HIV.  Talk with your health care provider about whether you are at high risk of being infected with HIV. If you choose to begin PrEP, you should first be tested for HIV. You should then be tested every 3 months for as long as you are taking PrEP.  Use sunscreen. Apply sunscreen liberally and repeatedly throughout the day. You should seek shade when your shadow is shorter than you. Protect yourself by wearing long sleeves, pants, a wide-brimmed hat, and sunglasses year round whenever you are outdoors.  Tell your health care provider of new moles or changes in moles, especially if there is a change in shape or color. Also, tell your health care provider if a mole is larger than the size of a pencil eraser.  A one-time screening for abdominal aortic aneurysm (AAA) and surgical repair of large AAAs by ultrasound is recommended for men aged 20-75 years who are current or former smokers.  Stay current with your vaccines (immunizations).   This information is not intended to replace advice given to you by your health care provider. Make sure you discuss any questions you have with your health care provider.   Document Released: 05/17/2008 Document Revised: 12/10/2014 Document Reviewed: 04/16/2011 Elsevier Interactive Patient Education [employer].

## 2016-04-24 NOTE — Progress Notes (Signed)
Subjective:    Patient ID: Antonio Rogers, male    DOB: 1986/02/14, 30 y.o.   MRN: [id number]  HPI  Patient presents for yearly preventative medicine examination. Medicare questionnaire was completed  All immunizations and health maintenance protocols were reviewed with the patient and needed orders were placed.  Medication reconciliation,  past medical history, social history, problem list and allergies were reviewed in detail with the patient  Goals were established with regard to weight loss, exercise, and  diet in compliance with medications  He is exercising again with cardio and is working on his diet to eat healthier.   Wt Readings from Last 3 Encounters:  04/24/16 217 lb 6.4 oz (98.612 kg)  12/23/15 215 lb (97.523 kg)   He denies any chest pain, SOB, headaches, blurred vision.   Reports that his blood pressures at home have been between [id number] systolic.   He is still smoking but is down to less than a half pack a day. His wants to quit but does not want help. " Cold [place] is the best for me."    Review of Systems  Constitutional: Negative.   HENT: Negative.   Eyes: Negative.   Respiratory: Negative.   Cardiovascular: Negative.   Gastrointestinal: Negative.   Endocrine: Negative.   Genitourinary: Negative.   Musculoskeletal: Negative.   Allergic/Immunologic: Negative.   Neurological: Negative.   Hematological: Negative.   Psychiatric/Behavioral: Negative.   All other systems reviewed and are negative.  Past Medical History  Diagnosis Date  . Drug abuse and dependence ([place])     cocaine   . Chicken pox   . Depression   . Hypertension     high blood pressure readings  . Anxiety     Social History   Social History  . Marital Status: Married    Spouse Name: N/A  . Number of Children: N/A  . Years of Education: N/A   Occupational History  . Not on file.   Social History Main Topics  . Smoking status: Current Every Day Smoker -- 1.00  packs/day    Types: Cigarettes  . Smokeless tobacco: Not on file  . Alcohol Use: 0.0 oz/week    0 Standard drinks or equivalent per week     Comment: daily; 1 to 2 glasses of wine a night   . Drug Use: Not on file  . Sexual Activity: Not on file   Other Topics Concern  . Not on file   Social History Narrative   Works in [employer]   No kids   Married    Likes to play golf, fish, travel.     Past Surgical History  Procedure Laterality Date  . Tonsillectomy  2009    Family History  Problem Relation Age of Onset  . Hypercholesterolemia Father   . High blood pressure Father   . Drug abuse Paternal Uncle   . Drug abuse Maternal Uncle   . Schizophrenia Maternal Grandfather   . Sudden Cardiac Death Brother     cocaine use  . Thrombosis Brother     Allergies  Allergen Reactions  . Penicillins Anaphylaxis    Current Outpatient Prescriptions on File Prior to Visit  Medication Sig Dispense Refill  . hydrOXYzine (ATARAX/VISTARIL) 50 MG tablet TAKE 1/2-1 TABLET BY MOUTH EVERY 6 HOURS AS NEEDED FOR ANXIETY  2  . traZODone (DESYREL) 50 MG tablet Take 150 mg by mouth at bedtime.   0  . venlafaxine XR (EFFEXOR-XR) 37.5 MG 24 hr  capsule Taking 75 mg daily  0   No current facility-administered medications on file prior to visit.    BP 160/98 mmHg  Temp(Src) 97.8 F (36.6 C) (Oral)  Ht 6\' 1"  (1.854 m)  Wt 217 lb 6.4 oz (98.612 kg)  BMI 28.69 kg/m2       Objective:   Physical Exam  Constitutional: He is oriented to person, place, and time. He appears well-developed and well-nourished. No distress.  HENT:  Head: Normocephalic and atraumatic.  Right Ear: External ear normal.  Left Ear: External ear normal.  Nose: Nose normal.  Mouth/Throat: Oropharynx is clear and moist. No oropharyngeal exudate.  Eyes: Conjunctivae are normal. Pupils are equal, round, and reactive to light.  Cardiovascular: Normal rate, regular rhythm, normal heart sounds and intact distal pulses.   Exam reveals no gallop.   No murmur heard. Pulmonary/Chest: Effort normal and breath sounds normal. No respiratory distress. He has no wheezes. He has no rales. He exhibits no tenderness.  Abdominal: Soft. Bowel sounds are normal. He exhibits no distension and no mass. There is no tenderness. There is no rebound and no guarding.  Musculoskeletal: Normal range of motion. He exhibits no edema or tenderness.  Neurological: He is alert and oriented to person, place, and time.  Skin: Skin is warm and dry. No rash noted. He is not diaphoretic. No erythema. No pallor.  Psychiatric: He has a normal mood and affect. His behavior is normal. Judgment and thought content normal.  Nursing note and vitals reviewed.     Assessment & Plan:  1. Routine general medical examination at a health care facility - he needs to eat healthier, exercise more, and quit smoking - Follow up in three months  2. Essential hypertension - Will start on lisinopril 20 mg daily. He is going to send me his blood pressure readings in the next week.   3. Hyperlipidemia - His triglycerides are high. He would like to try and lower them through diet and exercise before going on medication.  - Follow up in three months for recheck.  - Cut out friend foods, fast foods, processed foods.   Dorothyann Peng, NP

## 2016-07-25 ENCOUNTER — Ambulatory Visit: Payer: BLUE CROSS/BLUE SHIELD | Admitting: Adult Health

## 2016-09-25 ENCOUNTER — Ambulatory Visit: Payer: BLUE CROSS/BLUE SHIELD | Admitting: Adult Health

## 2016-09-25 DIAGNOSIS — Z0289 Encounter for other administrative examinations: Secondary | ICD-10-CM

## 2017-01-11 ENCOUNTER — Other Ambulatory Visit: Payer: Self-pay | Admitting: Adult Health

## 2017-07-18 ENCOUNTER — Other Ambulatory Visit: Payer: Self-pay | Admitting: Adult Health

## 2017-07-18 NOTE — Telephone Encounter (Signed)
Denied.  Over 1 year since seen.  Needs office visit/cpx.

## 2017-07-23 ENCOUNTER — Other Ambulatory Visit: Payer: Self-pay | Admitting: Adult Health

## 2017-07-24 NOTE — Telephone Encounter (Signed)
Denied.  Past due for yearly.  Needs an appointment.

## 2020-12-29 ENCOUNTER — Inpatient Hospital Stay (HOSPITAL_COMMUNITY)
Admission: EM | Admit: 2020-12-29 | Discharge: 2021-01-03 | DRG: 023 | Disposition: A | Discharge status: Home / Self Care | Payer: BC Managed Care – PPO | Attending: Neurology | Admitting: Neurology

## 2020-12-29 ENCOUNTER — Emergency Department (HOSPITAL_COMMUNITY): Payer: BC Managed Care – PPO

## 2020-12-29 ENCOUNTER — Emergency Department (HOSPITAL_COMMUNITY): Payer: BC Managed Care – PPO | Admitting: Certified Registered"

## 2020-12-29 ENCOUNTER — Other Ambulatory Visit: Payer: Self-pay

## 2020-12-29 ENCOUNTER — Inpatient Hospital Stay (HOSPITAL_COMMUNITY): Payer: BC Managed Care – PPO

## 2020-12-29 ENCOUNTER — Encounter (HOSPITAL_COMMUNITY)
Admission: EM | Disposition: A | Discharge status: Home / Self Care | Payer: Self-pay | Source: Home / Self Care | Attending: Neurology

## 2020-12-29 DIAGNOSIS — F419 Anxiety disorder, unspecified: Secondary | ICD-10-CM | POA: Diagnosis present

## 2020-12-29 DIAGNOSIS — W000XXA Fall on same level due to ice and snow, initial encounter: Secondary | ICD-10-CM | POA: Diagnosis present

## 2020-12-29 DIAGNOSIS — Z560 Unemployment, unspecified: Secondary | ICD-10-CM

## 2020-12-29 DIAGNOSIS — G8194 Hemiplegia, unspecified affecting left nondominant side: Secondary | ICD-10-CM | POA: Diagnosis present

## 2020-12-29 DIAGNOSIS — R29810 Facial weakness: Secondary | ICD-10-CM | POA: Diagnosis present

## 2020-12-29 DIAGNOSIS — I63311 Cerebral infarction due to thrombosis of right middle cerebral artery: Secondary | ICD-10-CM | POA: Diagnosis present

## 2020-12-29 DIAGNOSIS — Y900 Blood alcohol level of less than 20 mg/100 ml: Secondary | ICD-10-CM | POA: Diagnosis present

## 2020-12-29 DIAGNOSIS — K5900 Constipation, unspecified: Secondary | ICD-10-CM | POA: Diagnosis present

## 2020-12-29 DIAGNOSIS — H518 Other specified disorders of binocular movement: Secondary | ICD-10-CM | POA: Diagnosis present

## 2020-12-29 DIAGNOSIS — Z23 Encounter for immunization: Secondary | ICD-10-CM

## 2020-12-29 DIAGNOSIS — Z20822 Contact with and (suspected) exposure to covid-19: Secondary | ICD-10-CM | POA: Diagnosis present

## 2020-12-29 DIAGNOSIS — R29708 NIHSS score 8: Secondary | ICD-10-CM | POA: Diagnosis present

## 2020-12-29 DIAGNOSIS — J95821 Acute postprocedural respiratory failure: Secondary | ICD-10-CM | POA: Diagnosis not present

## 2020-12-29 DIAGNOSIS — H53462 Homonymous bilateral field defects, left side: Secondary | ICD-10-CM | POA: Diagnosis present

## 2020-12-29 DIAGNOSIS — I6389 Other cerebral infarction: Secondary | ICD-10-CM

## 2020-12-29 DIAGNOSIS — E538 Deficiency of other specified B group vitamins: Secondary | ICD-10-CM | POA: Diagnosis present

## 2020-12-29 DIAGNOSIS — E871 Hypo-osmolality and hyponatremia: Secondary | ICD-10-CM | POA: Diagnosis present

## 2020-12-29 DIAGNOSIS — Z8241 Family history of sudden cardiac death: Secondary | ICD-10-CM

## 2020-12-29 DIAGNOSIS — I639 Cerebral infarction, unspecified: Secondary | ICD-10-CM

## 2020-12-29 DIAGNOSIS — I10 Essential (primary) hypertension: Secondary | ICD-10-CM | POA: Diagnosis present

## 2020-12-29 DIAGNOSIS — Z79899 Other long term (current) drug therapy: Secondary | ICD-10-CM | POA: Diagnosis not present

## 2020-12-29 DIAGNOSIS — I63031 Cerebral infarction due to thrombosis of right carotid artery: Secondary | ICD-10-CM | POA: Diagnosis present

## 2020-12-29 DIAGNOSIS — F32A Depression, unspecified: Secondary | ICD-10-CM | POA: Diagnosis present

## 2020-12-29 DIAGNOSIS — Z880 Allergy status to penicillin: Secondary | ICD-10-CM | POA: Diagnosis not present

## 2020-12-29 DIAGNOSIS — F17210 Nicotine dependence, cigarettes, uncomplicated: Secondary | ICD-10-CM | POA: Diagnosis present

## 2020-12-29 DIAGNOSIS — Z8616 Personal history of COVID-19: Secondary | ICD-10-CM | POA: Diagnosis not present

## 2020-12-29 DIAGNOSIS — H5340 Unspecified visual field defects: Secondary | ICD-10-CM | POA: Diagnosis present

## 2020-12-29 DIAGNOSIS — D696 Thrombocytopenia, unspecified: Secondary | ICD-10-CM | POA: Diagnosis present

## 2020-12-29 DIAGNOSIS — I63231 Cerebral infarction due to unspecified occlusion or stenosis of right carotid arteries: Secondary | ICD-10-CM | POA: Diagnosis present

## 2020-12-29 DIAGNOSIS — Z83438 Family history of other disorder of lipoprotein metabolism and other lipidemia: Secondary | ICD-10-CM

## 2020-12-29 DIAGNOSIS — I161 Hypertensive emergency: Secondary | ICD-10-CM

## 2020-12-29 DIAGNOSIS — D72829 Elevated white blood cell count, unspecified: Secondary | ICD-10-CM | POA: Diagnosis present

## 2020-12-29 DIAGNOSIS — I7771 Dissection of carotid artery: Secondary | ICD-10-CM | POA: Diagnosis present

## 2020-12-29 DIAGNOSIS — K7689 Other specified diseases of liver: Secondary | ICD-10-CM | POA: Diagnosis present

## 2020-12-29 DIAGNOSIS — E785 Hyperlipidemia, unspecified: Secondary | ICD-10-CM | POA: Diagnosis present

## 2020-12-29 DIAGNOSIS — R9431 Abnormal electrocardiogram [ECG] [EKG]: Secondary | ICD-10-CM | POA: Diagnosis present

## 2020-12-29 DIAGNOSIS — R414 Neurologic neglect syndrome: Secondary | ICD-10-CM | POA: Diagnosis present

## 2020-12-29 DIAGNOSIS — I63131 Cerebral infarction due to embolism of right carotid artery: Secondary | ICD-10-CM | POA: Diagnosis not present

## 2020-12-29 DIAGNOSIS — E876 Hypokalemia: Secondary | ICD-10-CM | POA: Diagnosis present

## 2020-12-29 DIAGNOSIS — F1010 Alcohol abuse, uncomplicated: Secondary | ICD-10-CM | POA: Diagnosis present

## 2020-12-29 HISTORY — PX: IR ANGIO VERTEBRAL SEL VERTEBRAL UNI L MOD SED: IMG5367

## 2020-12-29 HISTORY — PX: RADIOLOGY WITH ANESTHESIA: SHX6223

## 2020-12-29 HISTORY — PX: IR PERCUTANEOUS ART THROMBECTOMY/INFUSION INTRACRANIAL INC DIAG ANGIO: IMG6087

## 2020-12-29 HISTORY — PX: IR ANGIO INTRA EXTRACRAN SEL COM CAROTID INNOMINATE UNI L MOD SED: IMG5358

## 2020-12-29 LAB — PROTIME-INR
INR: 1.3 — ABNORMAL HIGH (ref 0.8–1.2)
Prothrombin Time: 15.8 seconds — ABNORMAL HIGH (ref 11.4–15.2)

## 2020-12-29 LAB — I-STAT CHEM 8, ED
BUN: 42 mg/dL — ABNORMAL HIGH (ref 6–20)
Calcium, Ion: 0.79 mmol/L — CL (ref 1.15–1.40)
Chloride: 80 mmol/L — ABNORMAL LOW (ref 98–111)
Creatinine, Ser: 0.8 mg/dL (ref 0.61–1.24)
Glucose, Bld: 163 mg/dL — ABNORMAL HIGH (ref 70–99)
HCT: 48 % (ref 39.0–52.0)
Hemoglobin: 16.3 g/dL (ref 13.0–17.0)
Potassium: 3.6 mmol/L (ref 3.5–5.1)
Sodium: 125 mmol/L — ABNORMAL LOW (ref 135–145)
TCO2: 35 mmol/L — ABNORMAL HIGH (ref 22–32)

## 2020-12-29 LAB — DIFFERENTIAL
Abs Immature Granulocytes: 0 10*3/uL (ref 0.00–0.07)
Basophils Absolute: 0 10*3/uL (ref 0.0–0.1)
Basophils Relative: 0 %
Eosinophils Absolute: 0 10*3/uL (ref 0.0–0.5)
Eosinophils Relative: 0 %
Lymphocytes Relative: 28 %
Lymphs Abs: 2 10*3/uL (ref 0.7–4.0)
Monocytes Absolute: 0.3 10*3/uL (ref 0.1–1.0)
Monocytes Relative: 4 %
Neutro Abs: 4.8 10*3/uL (ref 1.7–7.7)
Neutrophils Relative %: 68 %
nRBC: 1 /100 WBC — ABNORMAL HIGH

## 2020-12-29 LAB — COMPREHENSIVE METABOLIC PANEL
ALT: 21 U/L (ref 0–44)
AST: 43 U/L — ABNORMAL HIGH (ref 15–41)
Albumin: 2.8 g/dL — ABNORMAL LOW (ref 3.5–5.0)
Alkaline Phosphatase: 79 U/L (ref 38–126)
Anion gap: 18 — ABNORMAL HIGH (ref 5–15)
BUN: 28 mg/dL — ABNORMAL HIGH (ref 6–20)
CO2: 29 mmol/L (ref 22–32)
Calcium: 7.9 mg/dL — ABNORMAL LOW (ref 8.9–10.3)
Chloride: 81 mmol/L — ABNORMAL LOW (ref 98–111)
Creatinine, Ser: 1.11 mg/dL (ref 0.61–1.24)
GFR, Estimated: >60 mL/min (ref >60)
Glucose, Bld: 165 mg/dL — ABNORMAL HIGH (ref 70–99)
Potassium: 2.8 mmol/L — ABNORMAL LOW (ref 3.5–5.1)
Sodium: 128 mmol/L — ABNORMAL LOW (ref 135–145)
Total Bilirubin: 2.2 mg/dL — ABNORMAL HIGH (ref 0.3–1.2)
Total Protein: 6.3 g/dL — ABNORMAL LOW (ref 6.5–8.1)

## 2020-12-29 LAB — CBC
HCT: 43.2 % (ref 39.0–52.0)
Hemoglobin: 15.2 g/dL (ref 13.0–17.0)
MCH: 33.1 pg (ref 26.0–34.0)
MCHC: 35.2 g/dL (ref 30.0–36.0)
MCV: 94.1 fL (ref 80.0–100.0)
Platelets: 133 10*3/uL — ABNORMAL LOW (ref 150–400)
RBC: 4.59 MIL/uL (ref 4.22–5.81)
RDW: 17.7 % — ABNORMAL HIGH (ref 11.5–15.5)
WBC: 7 10*3/uL (ref 4.0–10.5)
nRBC: 0 % (ref 0.0–0.2)

## 2020-12-29 LAB — ETHANOL: Alcohol, Ethyl (B): <10 mg/dL (ref <10)

## 2020-12-29 LAB — HIV ANTIBODY (ROUTINE TESTING W REFLEX): HIV Screen 4th Generation wRfx: NONREACTIVE

## 2020-12-29 LAB — APTT: aPTT: 27 seconds (ref 24–36)

## 2020-12-29 LAB — MRSA PCR SCREENING: MRSA by PCR: NEGATIVE

## 2020-12-29 LAB — SODIUM: Sodium: 130 mmol/L — ABNORMAL LOW (ref 135–145)

## 2020-12-29 LAB — CK: Total CK: 43 U/L — ABNORMAL LOW (ref 49–397)

## 2020-12-29 LAB — ANTITHROMBIN III: AntiThromb III Func: 87 % (ref 75–120)

## 2020-12-29 LAB — MAGNESIUM: Magnesium: 2 mg/dL (ref 1.7–2.4)

## 2020-12-29 LAB — SARS CORONAVIRUS 2 BY RT PCR (HOSPITAL ORDER, PERFORMED IN ~~LOC~~ HOSPITAL LAB): SARS Coronavirus 2: NEGATIVE

## 2020-12-29 LAB — GAMMA GT: GGT: 110 U/L — ABNORMAL HIGH (ref 7–50)

## 2020-12-29 SURGERY — IR WITH ANESTHESIA
Anesthesia: General

## 2020-12-29 MED ORDER — SODIUM CHLORIDE 0.9 % IV BOLUS: 1000.0000 mL | Freq: Once | INTRAVENOUS | Status: DC

## 2020-12-29 MED ORDER — ACETAMINOPHEN 160 MG/5ML PO SOLN: 650.0000 mg | ORAL | Status: DC | PRN

## 2020-12-29 MED ORDER — EPTIFIBATIDE 20 MG/10ML IV SOLN
INTRAVENOUS | Status: AC
  Filled 2020-12-29: qty 10

## 2020-12-29 MED ORDER — SENNOSIDES-DOCUSATE SODIUM 8.6-50 MG PO TABS: 1.0000 | ORAL_TABLET | Freq: Every evening | ORAL | Status: DC | PRN

## 2020-12-29 MED ORDER — SODIUM CHLORIDE 0.9 % IV SOLN: INTRAVENOUS | Status: DC

## 2020-12-29 MED ORDER — VANCOMYCIN HCL IN DEXTROSE 1-5 GM/200ML-% IV SOLN
INTRAVENOUS | Status: AC
  Filled 2020-12-29: qty 200

## 2020-12-29 MED ORDER — ROCURONIUM BROMIDE 10 MG/ML (PF) SYRINGE
PREFILLED_SYRINGE | INTRAVENOUS | Status: DC | PRN
  Administered 2020-12-29: 70 mg via INTRAVENOUS

## 2020-12-29 MED ORDER — VERAPAMIL HCL 2.5 MG/ML IV SOLN
INTRAVENOUS | Status: AC
  Filled 2020-12-29: qty 2

## 2020-12-29 MED ORDER — LABETALOL HCL 5 MG/ML IV SOLN
10.0000 mg | INTRAVENOUS | Status: DC | PRN
Start: 2020-12-29 — End: 2021-01-03
  Administered 2020-12-29 (×2): 10 mg via INTRAVENOUS
  Administered 2020-12-29 – 2020-12-30 (×2): 20 mg via INTRAVENOUS
  Administered 2020-12-30: 10 mg via INTRAVENOUS
  Filled 2020-12-29 (×3): qty 4

## 2020-12-29 MED ORDER — CLEVIDIPINE BUTYRATE 0.5 MG/ML IV EMUL: 0.0000 mg/h | INTRAVENOUS | Status: DC

## 2020-12-29 MED ORDER — VANCOMYCIN HCL 1000 MG IV SOLR
INTRAVENOUS | Status: DC | PRN
  Administered 2020-12-29: 1000 mg via INTRAVENOUS

## 2020-12-29 MED ORDER — FENTANYL CITRATE (PF) 250 MCG/5ML IJ SOLN
INTRAMUSCULAR | Status: DC | PRN
  Administered 2020-12-29: 100 ug via INTRAVENOUS

## 2020-12-29 MED ORDER — ASPIRIN 325 MG PO TABS
325.0000 mg | ORAL_TABLET | Freq: Every day | ORAL | Status: DC
  Administered 2020-12-30: 325 mg via ORAL
  Filled 2020-12-29: qty 1

## 2020-12-29 MED ORDER — ASPIRIN 300 MG RE SUPP: 300.0000 mg | Freq: Every day | RECTAL | Status: DC

## 2020-12-29 MED ORDER — PROPOFOL 10 MG/ML IV BOLUS
INTRAVENOUS | Status: DC | PRN
  Administered 2020-12-29: 200 mg via INTRAVENOUS

## 2020-12-29 MED ORDER — SODIUM CHLORIDE (PF) 0.9 % IJ SOLN
INTRAVENOUS | Status: AC | PRN
  Administered 2020-12-29: 25 ug via INTRA_ARTERIAL

## 2020-12-29 MED ORDER — IOHEXOL 300 MG/ML  SOLN: 150.0000 mL | Freq: Once | INTRAMUSCULAR | Status: DC | PRN

## 2020-12-29 MED ORDER — PROPOFOL 500 MG/50ML IV EMUL
INTRAVENOUS | Status: DC | PRN
  Administered 2020-12-29: 50 ug/kg/min via INTRAVENOUS

## 2020-12-29 MED ORDER — ACETAMINOPHEN 325 MG PO TABS: 650.0000 mg | ORAL_TABLET | ORAL | Status: DC | PRN

## 2020-12-29 MED ORDER — CLOPIDOGREL BISULFATE 300 MG PO TABS
ORAL_TABLET | ORAL | Status: AC
  Filled 2020-12-29: qty 1

## 2020-12-29 MED ORDER — DEXAMETHASONE SODIUM PHOSPHATE 10 MG/ML IJ SOLN
INTRAMUSCULAR | Status: DC | PRN
  Administered 2020-12-29: 10 mg via INTRAVENOUS

## 2020-12-29 MED ORDER — METOPROLOL TARTRATE 25 MG PO TABS: 25.0000 mg | ORAL_TABLET | Freq: Two times a day (BID) | ORAL | Status: DC

## 2020-12-29 MED ORDER — CALCIUM GLUCONATE-NACL 1-0.675 GM/50ML-% IV SOLN
1.0000 g | Freq: Once | INTRAVENOUS | Status: DC
  Filled 2020-12-29: qty 50

## 2020-12-29 MED ORDER — ACETAMINOPHEN 650 MG RE SUPP
650.0000 mg | RECTAL | Status: DC | PRN
Start: 2020-12-29 — End: 2020-12-29

## 2020-12-29 MED ORDER — LABETALOL HCL 5 MG/ML IV SOLN
INTRAVENOUS | Status: AC
  Filled 2020-12-29: qty 4

## 2020-12-29 MED ORDER — ACETAMINOPHEN 325 MG PO TABS
650.0000 mg | ORAL_TABLET | ORAL | Status: DC | PRN
  Filled 2020-12-29: qty 2

## 2020-12-29 MED ORDER — NITROGLYCERIN 1 MG/10 ML FOR IR/CATH LAB
INTRA_ARTERIAL | Status: AC
  Filled 2020-12-29: qty 10

## 2020-12-29 MED ORDER — SODIUM CHLORIDE 0.9 % IV SOLN: INTRAVENOUS | Status: DC | PRN

## 2020-12-29 MED ORDER — SUGAMMADEX SODIUM 200 MG/2ML IV SOLN
INTRAVENOUS | Status: DC | PRN
  Administered 2020-12-29: 200 mg via INTRAVENOUS

## 2020-12-29 MED ORDER — CANGRELOR TETRASODIUM 50 MG IV SOLR
INTRAVENOUS | Status: AC
  Filled 2020-12-29: qty 50

## 2020-12-29 MED ORDER — IOHEXOL 350 MG/ML SOLN
60.0000 mL | Freq: Once | INTRAVENOUS | Status: AC | PRN
  Administered 2020-12-29: 60 mL via INTRAVENOUS

## 2020-12-29 MED ORDER — TICAGRELOR 90 MG PO TABS
ORAL_TABLET | ORAL | Status: AC
  Filled 2020-12-29: qty 2

## 2020-12-29 MED ORDER — SUCCINYLCHOLINE CHLORIDE 200 MG/10ML IV SOSY
PREFILLED_SYRINGE | INTRAVENOUS | Status: DC | PRN
  Administered 2020-12-29: 140 mg via INTRAVENOUS

## 2020-12-29 MED ORDER — ACETAMINOPHEN 650 MG RE SUPP: 650.0000 mg | RECTAL | Status: DC | PRN

## 2020-12-29 MED ORDER — POTASSIUM CHLORIDE CRYS ER 20 MEQ PO TBCR: 40.0000 meq | EXTENDED_RELEASE_TABLET | Freq: Once | ORAL | Status: DC

## 2020-12-29 MED ORDER — IOHEXOL 240 MG/ML SOLN
INTRAMUSCULAR | Status: AC
  Filled 2020-12-29: qty 200

## 2020-12-29 MED ORDER — LIDOCAINE 2% (20 MG/ML) 5 ML SYRINGE
INTRAMUSCULAR | Status: DC | PRN
  Administered 2020-12-29: 100 mg via INTRAVENOUS

## 2020-12-29 MED ORDER — TIROFIBAN HCL IN NACL 5-0.9 MG/100ML-% IV SOLN
INTRAVENOUS | Status: AC
  Filled 2020-12-29: qty 100

## 2020-12-29 MED ORDER — STROKE: EARLY STAGES OF RECOVERY BOOK
Freq: Once | Status: DC
  Filled 2020-12-29: qty 1

## 2020-12-29 MED ORDER — IOHEXOL 300 MG/ML  SOLN: 50.0000 mL | Freq: Once | INTRAMUSCULAR | Status: DC | PRN

## 2020-12-29 MED ORDER — ASPIRIN 81 MG PO CHEW
CHEWABLE_TABLET | ORAL | Status: AC
  Filled 2020-12-29: qty 1

## 2020-12-29 NOTE — ED Triage Notes (Addendum)
Emergency Medicine Provider Triage Evaluation Note  Antonio Rogers , a 35 y.o. male  was evaluated in triage.  Pt complains of weakness.  Patient states that he was fine yesterday, woke up this morning with some weakness.  Parents came to check on him this morning, found him in the bathtub unable to get up and acting confused.  Patient states that he did not fall this morning.  No head injury. No drug use. On exam patient has obvious left arm weakness with some confusion.  Has never had a stroke before.  No blood thinners.  Review of Systems  Positive: Weakness,  confusion Negative: Fevers, chills, chest pain, shortness of breath.  Physical Exam  There were no vitals taken for this visit. Gen:   Awake, no distress  HEENT:  Atraumatic Resp:  Normal effort  Cardiac:  Normal rate  Abd:   Nondistended, nontender Neuro:  Patient is slow to respond, appears slightly confused.  Patient is alert to self and place.  Unsure of date.  No facial droop noted.  Patient does have obvious right-sided gaze preference.  Left arm weakness noted with positive pronator drift on left.  No lower extremity weakness noted.   Medical Decision Making  Medically screening exam initiated at 12:15 PM.  Appropriate orders placed.  Code stroke initiated.  Patient does not know exact time of when his weakness began, sometime this morning.  He needs to be emergently brought back to the ED for further evaluation for code stroke.  Nurse aware. Clinical Impression  35 year old male presenting with code stroke.  Acute onset of symptoms this morning, unsure what time.  Patient within 24-hour window, VAN positive.  Left-sided arm weakness with positive pronator drift and some aphasia and confusion noted.  Patient has been brought back to room 9 for further evaluation.  No prior stroke noted.       Alfredia Client, PA-C 12/29/20 1229

## 2020-12-29 NOTE — Anesthesia Postprocedure Evaluation (Signed)
Anesthesia Post Note  Patient: Antonio Rogers  Procedure(s) Performed: IR WITH ANESTHESIA (N/A )     Patient location during evaluation: PACU Anesthesia Type: General Level of consciousness: awake and alert Pain management: pain level controlled Vital Signs Assessment: post-procedure vital signs reviewed and stable Respiratory status: spontaneous breathing, nonlabored ventilation and respiratory function stable Cardiovascular status: blood pressure returned to baseline and stable Postop Assessment: no apparent nausea or vomiting Anesthetic complications: no   No complications documented.  Last Vitals:  Vitals:   12/29/20 1800 12/29/20 1815  BP: 123/80 121/82  Pulse: 69 70  Resp: (!) 22 (!) 26  Temp:  (!) 36.2 C  SpO2: 93% 95%    Last Pain:  Vitals:   12/29/20 1815  TempSrc:   PainSc: Asleep                 Dezerae Freiberger,W. EDMOND

## 2020-12-29 NOTE — ED Provider Notes (Addendum)
[name] EMERGENCY DEPARTMENT Provider Note   CSN: [id number] Arrival date & time: 12/29/20  1210  An emergency department physician performed an initial assessment on this suspected stroke patient at 1218.  History No chief complaint on file.   Antonio Rogers is a 35 y.o. male.  HPI   35 year old male presents the emergency department with concern for strokelike symptoms.  Patient evaluated in the CT scanner as a stroke page.  Neurology at bedside.  Report is that he has had weakness on the left side intermittently and worsening over the last 2 days.  On arrival patient has a concerning exam with a gaze deviation and left-sided weakness.  Past Medical History:  Diagnosis Date  . Anxiety   . Chicken pox   . Depression   . Drug abuse and dependence ([place])    cocaine   . Hypertension    high blood pressure readings    Patient Active Problem List   Diagnosis Date Noted  . 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 Hyperlipidemia 04/24/2016  . Essential hypertension 12/23/2015    Past Surgical History:  Procedure Laterality Date  . TONSILLECTOMY  2009       Family History  Problem Relation Age of Onset  . Hypercholesterolemia Father   . High blood pressure Father   . Drug abuse Paternal Uncle   . Drug abuse Maternal Uncle   . Schizophrenia Maternal Grandfather   . Sudden Cardiac Death Brother        cocaine use  . Thrombosis Brother     Social History   Tobacco Use  . Smoking status: Current Every Day Smoker    Packs/day: 1.00    Types: Cigarettes  Substance Use Topics  . Alcohol use: Yes    Alcohol/week: 0.0 standard drinks    Comment: daily; 1 to 2 glasses of wine a night     Home Medications Prior to Admission medications   Medication Sig Start Date End Date Taking? Authorizing Provider  hydrOXYzine (ATARAX/VISTARIL) 50 MG tablet Take 100 mg by mouth daily. 11/15/15  Yes [provider]  traZODone (DESYREL) 100 MG tablet Take 150 mg by mouth at bedtime. 11/03/20  Yes [provider]  venlafaxine XR (EFFEXOR-XR) 75 MG 24 hr capsule Take 150 mg by mouth daily. 10/29/20  Yes [provider]  lisinopril (PRINIVIL,ZESTRIL) 20 MG tablet TAKE 1 TABLET (20 MG TOTAL) BY MOUTH DAILY. Patient not taking: No sig reported 01/11/17   Dorothyann Peng, NP    Allergies    Penicillins  Review of Systems   Review of Systems  Unable to perform ROS: Acuity of condition    Physical Exam Updated Vital Signs BP (!) 152/93   Pulse 91   Temp 99 F (37.2 C) (Oral)   Resp (!) 23   Ht 6\' 1"  (1.854 m)   Wt 99.8 kg   SpO2 93%   BMI 29.03 kg/m   Physical Exam Vitals and nursing note reviewed.  Constitutional:      Appearance: Normal appearance.  HENT:     Head: Normocephalic.     Mouth/Throat:     Mouth: Mucous membranes are moist.  Cardiovascular:     Rate and Rhythm: Normal rate.  Pulmonary:     Effort: Pulmonary effort is normal. No respiratory distress.  Abdominal:     Palpations: Abdomen is soft.     Tenderness: There is no abdominal tenderness.  Skin:  General: Skin is warm.  Neurological:     Mental Status: He is alert.     Comments: See NIH  Psychiatric:        Mood and Affect: Mood normal.     ED Results / Procedures / Treatments   Labs (all labs ordered are listed, but only abnormal results are displayed) Labs Reviewed  PROTIME-INR - Abnormal; Notable for the following components:      Result Value   Prothrombin Time 15.8 (*)    INR 1.3 (*)    All other components within normal limits  CBC - Abnormal; Notable for the following components:   RDW 17.7 (*)    Platelets 133 (*)    All other components within normal limits  DIFFERENTIAL - Abnormal; Notable for the following components:   nRBC 1 (*)    All other components within normal limits  COMPREHENSIVE METABOLIC PANEL - Abnormal; Notable for the following components:    Sodium 128 (*)    Potassium 2.8 (*)    Chloride 81 (*)    Glucose, Bld 165 (*)    BUN 28 (*)    Calcium 7.9 (*)    Total Protein 6.3 (*)    Albumin 2.8 (*)    AST 43 (*)    Total Bilirubin 2.2 (*)    Anion gap 18 (*)    All other components within normal limits  I-STAT CHEM 8, ED - Abnormal; Notable for the following components:   Sodium 125 (*)    Chloride 80 (*)    BUN 42 (*)    Glucose, Bld 163 (*)    Calcium, Ion 0.79 (*)    TCO2 35 (*)    All other components within normal limits  SARS CORONAVIRUS 2 BY RT PCR (HOSPITAL ORDER, [facility])  ETHANOL  APTT  ANTITHROMBIN III  MAGNESIUM  RAPID URINE DRUG SCREEN, HOSP PERFORMED  URINALYSIS, ROUTINE W REFLEX MICROSCOPIC  PROTEIN C ACTIVITY  PROTEIN C, TOTAL  PROTEIN S ACTIVITY  PROTEIN S, TOTAL  LUPUS ANTICOAGULANT PANEL  BETA-2-GLYCOPROTEIN I ABS, IGG/M/A  HOMOCYSTEINE  FACTOR 5 LEIDEN  PROTHROMBIN GENE MUTATION  CARDIOLIPIN ANTIBODIES, IGG, IGM, IGA  OSMOLALITY  OSMOLALITY, URINE  SODIUM, URINE, RANDOM    EKG   Radiology CT Code Stroke CTA Head W/WO contrast  Result Date: 12/29/2020 CLINICAL DATA:  Left-sided weakness.  Stroke suspected. EXAM: CT ANGIOGRAPHY HEAD AND NECK TECHNIQUE: Multidetector CT imaging of the head and neck was performed using the standard protocol during bolus administration of intravenous contrast. Multiplanar CT image reconstructions and MIPs were obtained to evaluate the vascular anatomy. Carotid stenosis measurements (when applicable) are obtained utilizing NASCET criteria, using the distal internal carotid diameter as the denominator. CONTRAST:  50mL OMNIPAQUE IOHEXOL 350 MG/ML SOLN COMPARISON:  Head CT December 29, 2020. FINDINGS: CTA NECK FINDINGS Aortic arch: Standard branching. Imaged portion shows no evidence of aneurysm or dissection. No significant stenosis of the major arch vessel origins. Right carotid system: A mural thrombus is seen in the right carotid  bulb extending superiorly along the proximal and mid cervical segment of the right ICA resulting in severe stenosis. Left carotid system: Mild atherosclerotic changes. No evidence of dissection, stenosis (50% or greater) or occlusion. Vertebral arteries: Codominant. No evidence of dissection, stenosis (50% or greater) or occlusion. Skeleton: No acute findings. Other neck: Heterogeneous thyroid gland. Upper chest: Negative Review of the MIP images confirms the above findings CTA HEAD FINDINGS Anterior circulation: Intracranial right  ICA is patent. Luminal irregularity at the right distal M2/M3/MCA may represent subocclusive clot. The bilateral ACA and the left MCA vascular trees are patent. Posterior circulation: No significant stenosis, proximal occlusion, aneurysm, or vascular malformation. Venous sinuses: As permitted by contrast timing, patent. Review of the MIP images confirms the above findings IMPRESSION: 1. Thrombus in the right carotid bulb extending superiorly along the proximal and mid cervical segment of the right ICA resulting in severe stenosis. 2. Luminal irregularity at the right distal M2/M3/MCA may represent subocclusive clot. These results were called by telephone at the time of interpretation on 12/29/2020 at 1:10 pm to provider [facility] , who verbally acknowledged these results. Electronically Signed   By: Pedro Earls M.D.   On: 12/29/2020 13:19   CT Code Stroke CTA Neck W/WO contrast  Result Date: 12/29/2020 CLINICAL DATA:  Left-sided weakness.  Stroke suspected. EXAM: CT ANGIOGRAPHY HEAD AND NECK TECHNIQUE: Multidetector CT imaging of the head and neck was performed using the standard protocol during bolus administration of intravenous contrast. Multiplanar CT image reconstructions and MIPs were obtained to evaluate the vascular anatomy. Carotid stenosis measurements (when applicable) are obtained utilizing NASCET criteria, using the distal internal carotid diameter  as the denominator. CONTRAST:  87mL OMNIPAQUE IOHEXOL 350 MG/ML SOLN COMPARISON:  Head CT December 29, 2020. FINDINGS: CTA NECK FINDINGS Aortic arch: Standard branching. Imaged portion shows no evidence of aneurysm or dissection. No significant stenosis of the major arch vessel origins. Right carotid system: A mural thrombus is seen in the right carotid bulb extending superiorly along the proximal and mid cervical segment of the right ICA resulting in severe stenosis. Left carotid system: Mild atherosclerotic changes. No evidence of dissection, stenosis (50% or greater) or occlusion. Vertebral arteries: Codominant. No evidence of dissection, stenosis (50% or greater) or occlusion. Skeleton: No acute findings. Other neck: Heterogeneous thyroid gland. Upper chest: Negative Review of the MIP images confirms the above findings CTA HEAD FINDINGS Anterior circulation: Intracranial right ICA is patent. Luminal irregularity at the right distal M2/M3/MCA may represent subocclusive clot. The bilateral ACA and the left MCA vascular trees are patent. Posterior circulation: No significant stenosis, proximal occlusion, aneurysm, or vascular malformation. Venous sinuses: As permitted by contrast timing, patent. Review of the MIP images confirms the above findings IMPRESSION: 1. Thrombus in the right carotid bulb extending superiorly along the proximal and mid cervical segment of the right ICA resulting in severe stenosis. 2. Luminal irregularity at the right distal M2/M3/MCA may represent subocclusive clot. These results were called by telephone at the time of interpretation on 12/29/2020 at 1:10 pm to provider [facility] , who verbally acknowledged these results. Electronically Signed   By: Pedro Earls M.D.   On: 12/29/2020 13:19   MR BRAIN WO CONTRAST  Result Date: 12/29/2020 CLINICAL DATA:  Neuro deficit, acute, stroke suspected. EXAM: MRI HEAD WITHOUT CONTRAST TECHNIQUE: Multiplanar, multiecho pulse  sequences of the brain and surrounding structures were obtained without intravenous contrast. COMPARISON:  Noncontrast head CT and CT angiogram head/neck performed earlier today 12/29/2020. FINDINGS: Brain: Cerebral volume is normal for age. There are patchy cortical/subcortical infarcts within the right cerebral hemisphere within the right frontal, parietal, occipital and temporal lobes as well as right insula. These acute infarcts are present within the right MCA territory as well as MCA/PCA and MCA/ACA watershed territories. These infarcts overall moderate in volume. No significant mass effect at this time. There is mild SWI signal loss and T1 hyperintensity associated with  acute infarcts within the right frontal and occipital lobes consistent with petechial hemorrhage. Background mild multifocal T2/FLAIR hyperintensity within the cerebral white matter is nonspecific, but compatible with chronic small vessel ischemic disease. No evidence of intracranial mass. No extra-axial fluid collection. No midline shift. Vascular: Expected proximal arterial flow voids. Skull and upper cervical spine: No focal marrow lesion. Sinuses/Orbits: Visualized orbits show no acute finding. No significant paranasal sinus disease at the imaged levels. IMPRESSION: Patchy acute cortical/subcortical infarcts within the right cerebral hemisphere within the right MCA territory as well as MCA/PCA and MCA/ACA watershed territories. The infarcts are overall moderate in volume. No significant mass effect at this time. Petechial hemorrhage associated with an acute infarcts in the right frontal and right occipital lobes. Background mild chronic small vessel ischemic disease. Electronically Signed   By: Kellie Simmering DO   On: 12/29/2020 14:20   CT HEAD CODE STROKE WO CONTRAST  Result Date: 12/29/2020 CLINICAL DATA:  Code stroke.  Left-sided weakness. EXAM: CT HEAD WITHOUT CONTRAST TECHNIQUE: Contiguous axial images were obtained from the base of  the skull through the vertex without intravenous contrast. COMPARISON:  None. FINDINGS: Brain: No abnormality affects the brainstem or cerebellum. The left cerebral hemisphere is normal except for a questionable old white matter infarction in the left parietal region. On the right, there is evidence of acute infarction within the middle cerebral artery territory. Cortical and subcortical low-density seen at the margins of the MCA distribution, with diminished gray-white differentiation elsewhere within the MCA territory. Minimal petechial blood products in the area the right frontal portion of the infarction. No mass effect. No hydrocephalus. Vascular: No hyperdense vessel. Skull: Normal Sinuses/Orbits: Clear/normal Other: None ASPECTS ([place] Stroke Program Early CT Score) - Ganglionic level infarction (caudate, lentiform nuclei, internal capsule, insula, M1-M3 cortex): 3 - Supraganglionic infarction (M4-M6 cortex): 0 Total score (0-10 with 10 being normal): 3 IMPRESSION: Acute infarction in the right middle cerebral artery territory. Cortical and subcortical low-density at the margins of the infarction, with diminished gray-white differentiation elsewhere within the MCA territory. Minimal petechial blood products in the area of the right frontal portion of the infarction. No mass effect. Aspects score 3. These results were communicated to Dr. Curly Shores At 12:37 pmon 1/27/2022by text page via the [facility] messaging system. Electronically Signed   By: Nelson Chimes M.D.   On: 12/29/2020 12:38    Procedures .Critical Care Performed by: Lorelle Gibbs, DO Authorized by: Lorelle Gibbs, DO   Critical care provider statement:    Critical care time (minutes):  60   Critical care was time spent personally by me on the following activities:  Discussions with consultants, evaluation of patient's response to treatment, examination of patient, ordering and performing treatments and interventions, ordering and review  of laboratory studies, ordering and review of radiographic studies, pulse oximetry, re-evaluation of patient's condition, obtaining history from patient or surrogate and review of old charts     Medications Ordered in ED Medications  iohexol (OMNIPAQUE) 240 MG/ML injection (has no administration in time range)  aspirin 81 MG chewable tablet (has no administration in time range)  verapamil (ISOPTIN) 2.5 MG/ML injection (has no administration in time range)  ticagrelor (BRILINTA) 90 MG tablet (has no administration in time range)  clopidogrel (PLAVIX) 300 MG tablet (has no administration in time range)  cangrelor (KENGREAL) 50 MG SOLR (has no administration in time range)  tirofiban (AGGRASTAT) 5-0.9 MG/100ML-% injection (has no administration in time range)  eptifibatide (INTEGRILIN) 20 MG/10ML injection (  has no administration in time range)  calcium gluconate 1 g/ 50 mL sodium chloride IVPB (has no administration in time range)  sodium chloride 0.9 % bolus 1,000 mL (has no administration in time range)  potassium chloride SA (KLOR-CON) CR tablet 40 mEq (has no administration in time range)  nitroGLYCERIN 100 mcg/mL intra-arterial injection (has no administration in time range)  vancomycin (VANCOCIN) 1-5 GM/200ML-% IVPB (has no administration in time range)  iohexol (OMNIPAQUE) 300 MG/ML solution 150 mL (has no administration in time range)  iohexol (OMNIPAQUE) 300 MG/ML solution 50 mL (has no administration in time range)  iohexol (OMNIPAQUE) 350 MG/ML injection 60 mL (60 mLs Intravenous Contrast Given 12/29/20 1253)    ED Course  I have reviewed the triage vital signs and the nursing notes.  Pertinent labs & imaging results that were available during my care of the patient were reviewed by me and considered in my medical decision making (see chart for details).    MDM Rules/Calculators/A&P                          35 year old male presents the emergency department with concern for  strokelike symptoms.  Patient evaluated as a stroke page with neurology at bedside in the CT scanner.  He has a concerning physical exam with an NIH of 8.  CT of the head without shows significant hypodensity in the right side of the brain, concerning for stroke/ongoing stroke.  CTA identifies a right ICA clot, MRI was done and it appears the patient is a candidate for interventional radiology and he has been taken for treatment.  Of note the patient's EKG shows a prolonged QTC greater than 570.  I consulted with Dr. Terri Skains who agrees that this is a genuine QTC prolongation.  Patient is listed to be on trazodone, this can be a QTC prolonging medication, we recommend stop this medication.  Lab results show hyponatremia, hypokalemia and hypocalcemia.  I had ordered to replace these electrolytes however the patient was taken on the department before he was able to get any doses.  Magnesium came back at 2.  Recommendation right now is electrolyte repletion and avoiding any QTC prolongation drugs.  I have relayed this information to the neurology team who will be admitting the patient.  Final Clinical Impression(s) / ED Diagnoses Final diagnoses:  Stroke [facility])    Rx / DC Orders ED Discharge Orders    None       Lorelle Gibbs, DO 12/29/20 1453    Persephone Schriever, Alvin Critchley, DO 12/30/20 0706    Lorelle Gibbs, DO 12/30/20 [phone]

## 2020-12-29 NOTE — Transfer of Care (Signed)
Immediate Anesthesia Transfer of Care Note  Patient: Antonio Rogers  Procedure(s) Performed: IR WITH ANESTHESIA (N/A )  Patient Location: PACU  Anesthesia Type:General  Level of Consciousness: sedated and patient cooperative  Airway & Oxygen Therapy: Patient remains intubated per anesthesia plan and Patient placed on Ventilator (see vital sign flow sheet for setting)  Post-op Assessment: Report given to RN and Post -op Vital signs reviewed and stable  Post vital signs: Reviewed and stable  Last Vitals:  Vitals Value Taken Time  BP 128/74 12/29/20 1639  Temp 36.2 C 12/29/20 1630  Pulse 86 12/29/20 1641  Resp 26 12/29/20 1641  SpO2 98 % 12/29/20 1641  Vitals shown include unvalidated device data.  Last Pain:  Vitals:   12/29/20 1630  TempSrc:   PainSc: Asleep         Complications: No complications documented.

## 2020-12-29 NOTE — Progress Notes (Signed)
Patient ID: Antonio Rogers, male   DOB: 05/07/1986, 35 y.o.   MRN: [id number] INR. 81 Y RT H M MRS0 LSW  ? 2 days ago.  New onset  Lt sided weakness and Lt sided neglect?vision problems.RT gaze deviation and Lt sided weakness . CT brain subacute ischemic changes in watershed distribution and Rt occipital subcortical region. ASPECTS 3 NIH 8. CTA Long seg thrombus +/_ dissection RT ICA of prox 2/3. ? RT MCA branch thrombus. Endovascular treatment to revascularize  RT ICA D/W father over the phone.Procedure,reasons,alternatives reviewed. Risks of ICH of 10 %,new ischemic infarcts from distal embolization,worsening neuro deficit death,inability to revascularize reviewed. Father expressed understanding and provided consent for the treatment. S.Calyssa Zobrist MD

## 2020-12-29 NOTE — Anesthesia Preprocedure Evaluation (Signed)
Anesthesia Evaluation  Patient identified by MRN, date of birth, ID band Patient confused    Reviewed: NPO status , Patient's Chart, lab work & pertinent test results, Unable to perform ROS - Chart review onlyPreop documentation limited or incomplete due to emergent nature of procedure.  History of Anesthesia Complications Negative for: history of anesthetic complications  Airway Mallampati: II  TM Distance: >3 FB Neck ROM: Full    Dental  (+) Poor Dentition, Chipped, Missing, Dental Advisory Given   Pulmonary Current Smoker,  12/29/2020 SARS coronavirus NEG But pt had COVID infection 08/2020   breath sounds clear to auscultation       Cardiovascular hypertension, Pt. on medications (-) angina Rhythm:Regular Rate:Normal     Neuro/Psych Acute CVA: this am L sided weakness, R gaze preference, and difficulty with word finding    GI/Hepatic negative GI ROS, (+)     substance abuse (remote h/o substance abuse)  cocaine use,   Endo/Other  negative endocrine ROS  Renal/GU negative Renal ROS     Musculoskeletal   Abdominal   Peds  Hematology negative hematology ROS (+)   Anesthesia Other Findings   Reproductive/Obstetrics                             Anesthesia Physical Anesthesia Plan  ASA: IV and emergent  Anesthesia Plan: General   Post-op Pain Management:    Induction: Intravenous, Rapid sequence and Cricoid pressure planned  PONV Risk Score and Plan: 1 and Ondansetron and Treatment may vary due to age or medical condition  Airway Management Planned: Oral ETT  Additional Equipment: Arterial line  Intra-op Plan:   Post-operative Plan: Possible Post-op intubation/ventilation  Informed Consent: I have reviewed the patients History and Physical, chart, labs and discussed the procedure including the risks, benefits and alternatives for the proposed anesthesia with the patient or  authorized representative who has indicated his/her understanding and acceptance.     Dental advisory given  Plan Discussed with: CRNA and Surgeon  Anesthesia Plan Comments:         Anesthesia Quick Evaluation

## 2020-12-29 NOTE — ED Notes (Signed)
Patient transported to MRI 

## 2020-12-29 NOTE — Anesthesia Procedure Notes (Signed)
Arterial Line Insertion Start/End1/27/2022 2:17 PM, 12/29/2020 2:25 PM Performed by: Renato Shin, CRNA, CRNA  Preanesthetic checklist: patient identified, IV checked, site marked, risks and benefits discussed, surgical consent, monitors and equipment checked, pre-op evaluation, timeout performed and anesthesia consent Patient sedated Left, radial was placed Catheter size: 20 G Hand hygiene performed , maximum sterile barriers used  and Seldinger technique used  Attempts: 1 Procedure performed without using ultrasound guided technique. Following insertion, dressing applied and Biopatch. Post procedure assessment: normal and unchanged  Patient tolerated the procedure well with no immediate complications.

## 2020-12-29 NOTE — Sedation Documentation (Signed)
Handoff with PACU RN at [facility] from CT at 1620 - went to CT due to fluro issue in IR 2.  Patient's right groin site level 0, closed with a 8 fr angioseal at 1526. Dressing is gauze and tegaderm. Pulses are +1/dopper.

## 2020-12-29 NOTE — Procedures (Signed)
S/P  Bilateral common carotid and Lt Vert A angiograms. RT CFA approach. S/P complete revascularization of neat occlusive RT ICA due to long seg thrombus with x 1 pass with 6 mmx 40 mm solitaire retriever and aspiration achieving aI TICI 3 revascularization of the RT MCA.Marland Kitchen No underlying vessel abnormality noted. Patient left intubated.  44F angioseal for closure of RT CFA. Distal pulses all dopplerable. CT in CT suite. S.Stepahnie Campo MD

## 2020-12-29 NOTE — ED Notes (Signed)
Pt is out of monitor for MRI. Rapid Response nurse with pt at this time.

## 2020-12-29 NOTE — Progress Notes (Signed)
ON-CALL Cardiology:   Called by ER physician Dr. Dina Rich to review EKG.  35 year old comes in with Acute Stroke and an EKG was obtained which noted prolong QT interval. EKG reviewed, personally calculated QT interval as per V5 to be 577msec.  He has multiple electrolyte abnormalities (Na 125, Potassium 2.8 (initially), ionized Ca 0.79). Medication wise he takes trazadone as well per ER physician.  Recommended checking Mg level, ETOH level, UDS.  Electrolyte replacement as per protocol.  No official consult requested at this time.  Plan of care discussed with Dr. Dina Rich.   M[facility]  Pager: [phone] Office: [phone] 12/29/20, 1:59 PM

## 2020-12-29 NOTE — Code Documentation (Addendum)
Stroke Response Nurse Documentation Code Documentation  Antonio Rogers is a 35 y.o. male arriving to [place] ED via [facility] on 12/29/2020 with past medical hx of anxiety and depression. Code stroke was activated by ED. Patient from home where he was LKW at two days ago when he started having trouble getting out of bed. This morning, he reports having significant left sided weakness and trouble seeing after he woke up.   Stroke team at the bedside on patient arrival. Labs drawn and patient cleared for CT by Dr. Dina Rich. Patient to CT with team. NIHSS 8, see documentation for details and code stroke times. Patient with right gaze preference , left hemianopia, left facial droop, left arm weakness, left leg weakness and Sensory  neglect on exam. The following imaging was completed:  CT, CTA head and neck, CTP. Patient is not a candidate for tPA due to being outside window. Patient has an ASPECTS 3 per MD Bhagat and not a candidate for thrombectomy. Care/Plan: q2 mNIHSS/VS. Bedside handoff with ED RN Garlon Hatchet.    Kathrin Greathouse  Stroke Response RN

## 2020-12-29 NOTE — ED Notes (Signed)
Pt continues on MRI unable to get Vitals

## 2020-12-29 NOTE — ED Notes (Signed)
Pt was taken to IR by RRN after MRI pt never returned to room.

## 2020-12-29 NOTE — Progress Notes (Signed)
eLink Physician-Brief Progress Note Patient Name: Antonio Rogers DOB: 1986-06-20 MRN: [id number]   Date of Service  12/29/2020  HPI/Events of Note  Patient with a multiple right sided watershed infarcts and right carotid artery thrombus s/p thrombectomy in neuro IR. Patient was extubated post-procedure.  eICU Interventions  New Patient Evaluation completed.        Frederik Pear 12/29/2020, 10:46 PM

## 2020-12-29 NOTE — Consult Note (Addendum)
NAME:  Enki Masley, MRN:  [id number], DOB:  May 17, 1986, LOS: 0 ADMISSION DATE:  12/29/2020, CONSULTATION DATE:  12/29/2020  REFERRING MD:  Curly Shores, MD, CHIEF COMPLAINT:  weakness   Brief History:  35 year old with a history of remote cocaine abuse admitted with right-sided watershed infarcts, right carotid artery thrombus , status post thrombectomy by neuro IR  History of Present Illness:  History obtained from chart review, patient is unable to provide detailed history since he is just extubated in the PACU.   Apparently parents found him at 15 AM after they received a text from him, having difficulty out of bed for 2 days.  He was found on the floor, brought in by EMS.  Code stroke eventually activated.  CT angiogram showed free-floating thrombus in the right carotid artery with suspicion of possible carotid dissection.  MRI confirmed patchy infarcts in the right cerebral hemisphere as well as MCA/PCA and MCA/ACA watershed territories Underwent complete revascularization of right ICA long segment thrombus and revascularization of right MCA, brought to PACU intubated where he was subsequently extubated.  PCCM consulted for further management He was noted to be hyponatremic, hypocalcemic and hypokalemic  Past Medical History:  Tobacco abuse Remote history of cocaine abuse Anxiety Hypertension  Significant Hospital Events:    Consults:  Neurology Neuro IR  Procedures:  1/27 neuro IR S/P  Bilateral common carotid and Lt Vert A angiograms. RT CFA approach. S/P complete revascularization of neat occlusive RT ICA due to long seg thrombus   Significant Diagnostic Tests:  CTA head and neck 1/27 Thrombus in the right carotid bulb extending superiorly along the proximal and mid cervical segment of the right ICA resulting in severe stenosis.  Luminal irregularity at the right distal M2/M3/MCA may represent subocclusive clot  MRI 1/27 Patchy acute cortical/subcortical infarcts  within the right cerebral hemisphere within the right MCA territory as well as MCA/PCA and MCA/ACA watershed Environmental consultant Data:    Antimicrobials:     Interim History / Subjective:    Objective   Blood pressure 128/74, pulse 86, temperature (!) 97.1 F (36.2 C), resp. rate (!) 27, height 6\' 1"  (1.854 m), weight 99.8 kg, SpO2 97 %.        Intake/Output Summary (Last 24 hours) at 12/29/2020 1714 Last data filed at 12/29/2020 1625 Gross per 24 hour  Intake 1500 ml  Output 100 ml  Net 1400 ml   Filed Weights   12/29/20 1249  Weight: 99.8 kg    Examination: General: Examined in PACU, young man lying supine, no distress HENT: Pupils left 5 mm reactive, right 3-4 mm reactive , no JVD Lungs: Clear breath sounds bilateral Cardiovascular: S1-S2 regular Abdomen: Soft nontender Extremities: Good pulses , left radial arterial line Neuro: Alert, interactive, slight weakness left lower extremity, unable to check left arm due to arterial line   Resolved Hospital Problem list     Assessment & Plan:  Watershed infarcts on the right due to right ICA thrombus?  Etiology Multiple electrolyte abnormalities  Postop respiratory failure -extubated in the PACU and seems to be tolerating well  Hypertension -use IV labetalol x1 and if still unable to achieve goal systolic blood pressure 1 20-1 40 range, okay to start  Cleviprex -Hold lisinopril for now  Hyponatremia -unclear cause , will use normal saline and slowly correct 6-8 over 24 hours Check TSH, urine sodium and urine/se osm for SIADH  Right ICA thrombus with watershed CVA -per neurology Antiplatelet agents Hypercoagulable panel being  checked His pupils are unequal but repeat head CT postprocedure does not show any evidence of cerebral edema  Hypocalcemia and hypokalemia will be repleted Prolonged QTC likely related to about, recheck in a.m. avoid QT prolonging agents Magnesium level seems to be okay Check urine drug  screen  Best practice (evaluated daily)  Diet: NPO until swallow eval Pain/Anxiety/Delirium protocol (if indicated): N/A  DVT prophylaxis: Lovenox when okay with neurology GI prophylaxis: Protonix Glucose control: N/A Mobility: PT Disposition: ICU for 24 hours  Goals of Care:   Code Status: Full  Labs   CBC: Recent Labs  Lab 12/29/20 1222 12/29/20 1229  WBC 7.0  --   NEUTROABS 4.8  --   HGB 15.2 16.3  HCT 43.2 48.0  MCV 94.1  --   PLT 133*  --     Basic Metabolic Panel: Recent Labs  Lab 12/29/20 1222 12/29/20 1229 12/29/20 1317  NA 128* 125*  --   K 2.8* 3.6  --   CL 81* 80*  --   CO2 29  --   --   GLUCOSE 165* 163*  --   BUN 28* 42*  --   CREATININE 1.11 0.80  --   CALCIUM 7.9*  --   --   MG  --   --  2.0   GFR: Estimated Creatinine Clearance: 158.7 mL/min (by C-G formula based on SCr of 0.8 mg/dL). Recent Labs  Lab 12/29/20 1222  WBC 7.0    Liver Function Tests: Recent Labs  Lab 12/29/20 1222  AST 43*  ALT 21  ALKPHOS 79  BILITOT 2.2*  PROT 6.3*  ALBUMIN 2.8*   No results for input(s): LIPASE, AMYLASE in the last 168 hours. No results for input(s): AMMONIA in the last 168 hours.  ABG    Component Value Date/Time   TCO2 35 (H) 12/29/2020 1229     Coagulation Profile: Recent Labs  Lab 12/29/20 1222  INR 1.3*    Cardiac Enzymes: No results for input(s): CKTOTAL, CKMB, CKMBINDEX, TROPONINI in the last 168 hours.  HbA1C: No results found for: HGBA1C  CBG: No results for input(s): GLUCAP in the last 168 hours.  Review of Systems:   No headache No chest pain, palpitations, orthopnea No cough, wheezing No fevers  Past Medical History:  He,  has a past medical history of Anxiety, Chicken pox, Depression, Drug abuse and dependence ([place]), and Hypertension.   Surgical History:   Past Surgical History:  Procedure Laterality Date  . TONSILLECTOMY  2009     Social History:   reports that he has been smoking cigarettes. He  has been smoking about 1.00 pack per day. He does not have any smokeless tobacco history on file. He reports current alcohol use.   Family History:  His family history includes Drug abuse in his maternal uncle and paternal uncle; High blood pressure in his father; Hypercholesterolemia in his father; Schizophrenia in his maternal grandfather; Sudden Cardiac Death in his brother; Thrombosis in his brother.   Allergies Allergies  Allergen Reactions  . Penicillins Anaphylaxis     Home Medications  Prior to Admission medications   Medication Sig Start Date End Date Taking? Authorizing Provider  hydrOXYzine (ATARAX/VISTARIL) 50 MG tablet Take 100 mg by mouth daily. 11/15/15  Yes [provider]  traZODone (DESYREL) 100 MG tablet Take 150 mg by mouth at bedtime. 11/03/20  Yes [provider]  venlafaxine XR (EFFEXOR-XR) 75 MG 24 hr capsule Take 150 mg by mouth daily. 10/29/20  Yes [provider]  lisinopril (PRINIVIL,ZESTRIL) 20 MG tablet TAKE 1 TABLET (20 MG TOTAL) BY MOUTH DAILY. Patient not taking: No sig reported 01/11/17   Dorothyann Peng, NP     Kara Mead MD. FCCP. [facility] See Amion for pager  If no response to pager , please call [phone] until 7 pm After 7:00 pm call Elink  [phone]   12/29/2020

## 2020-12-29 NOTE — ED Triage Notes (Signed)
Pt from home, family found him this morning sitting on the toilet unable to get up and walk. Pt has L sided weakness, R gaze preference, and difficulty with word finding. Unknown LSN. Reports having difficulty getting out of the shower yesterday at 1600, reports he started feeling weak this morning at 0700.

## 2020-12-29 NOTE — H&P (Addendum)
Neurology Consultation Reason for Consult: Code stroke Requesting Physician: Dr. Karle Starch  CC: Left sided weakness  History is obtained from: Patient, family and chart review  HPI: Antonio Rogers is a 35 y.o. male with a past medical history significant for remote cocaine abuse (sober since age 22), hypertension, depression, anxiety.  History is extremely challenging given the patient's left-sided neglect and the fact that he lives alone.  His parents report that they texted with him at 74 AM and when they found him at 79 AM he was having difficulty lifting his phone, but he reported he was having difficulty getting out of bed for as long as 2 days.  He was found on the floor unable to get up and EMS was activated.  On arrival to the ED initially code stroke was not activated given patient was out of the window but then due to potential acute worsening this morning code stroke was activated.  Head CT was concerning for a low aspect score but this was not felt to be concordant with his low NIH stroke scale of 8.  CTA revealed a free-floating thrombus in the right carotid artery, felt to be the culprit of the strokes, possibly dissection or carotid web underlying the thrombus.  In discussion with neurointerventional radiology, patient, family, decision was made to proceed with angiography to clarify the etiology of the thrombus and treat accordingly.  Risk of hemorrhage with antiplatelet agents that would likely be required in the event of the stent was explicitly discussed with family, with benefit being avoiding further potentially catastrophic/life-ending stroke  LKW: Likely 2 days ago tPA given?: No, out of the time window and evidence of petechial hemorrhage  IA performed?:  Yes, delayed due to need for MRI for clarification of stroke size/age prior to proceeding Premorbid modified rankin scale:      0 - No symptoms.  ROS: Limited review of systems was performed given emergent condition.   Patient denied headache, nausea, vomiting, though he did report some abdominal pain (3 weeks of constipation).  He confirmed that he had had COVID-19 infection over Labor Day weekend and that he was struggling with depression but was otherwise well.  He reports he is not working and lives alone with his dog.  He reported he is not actively using substances and his last drink was Thursday (drinks 1-3 drinks a week); parents report a history of binge drinking  Past Medical History:  Diagnosis Date  . Anxiety   . Chicken pox   . Depression   . Drug abuse and dependence ([place])    cocaine   . Hypertension    high blood pressure readings   Past Surgical History:  Procedure Laterality Date  . TONSILLECTOMY  2009   Family History  Problem Relation Age of Onset  . Hypercholesterolemia Father   . High blood pressure Father   . Drug abuse Paternal Uncle   . Drug abuse Maternal Uncle   . Schizophrenia Maternal Grandfather   . Sudden Cardiac Death Brother        cocaine use  . Thrombosis Brother     Social History:  reports that he has been smoking cigarettes. He has been smoking about 1.00 pack per day. He does not have any smokeless tobacco history on file. He reports current alcohol use. No history on file for drug use. He reports he drinks a few drinks a week and had his last drink on Thursday  Exam: Current vital signs: BP (!) 152/93  Pulse 91   Temp 99 F (37.2 C) (Oral)   Resp (!) 23   Ht 6\' 1"  (1.854 m)   Wt 99.8 kg   SpO2 93%   BMI 29.03 kg/m  Vital signs in last 24 hours: Temp:  [99 F (37.2 C)] 99 F (37.2 C) (01/27 1217) Pulse Rate:  [91-99] 91 (01/27 1315) Resp:  [18-27] 23 (01/27 1315) BP: (141-152)/(93-106) 152/93 (01/27 1315) SpO2:  [92 %-96 %] 93 % (01/27 1315) Weight:  [99.8 kg] 99.8 kg (01/27 1249)   Physical Exam  Constitutional: Appears well-developed and well-nourished.  Psych: Affect appropriate to situation Eyes: No scleral injection HENT: No OP  obstrucion MSK: no joint deformities.  Cardiovascular: Normal rate and regular rhythm.  Respiratory: Effort normal, non-labored breathing GI: Soft.  No distension. There is no tenderness.  Skin: WDI  Neuro: Mental Status: Patient is awake, alert, oriented to person, place, month, year, and situation, though intermittently reported it was February Patient is able to give a clear and coherent history. Patient has clear left-sided neglect, stating that his hand belongs to the examiner and grabbing right next to my left hand when asked him to grab a cord in the middle. Cranial Nerves: II: Visual Fields are notable for a left-sided hemianopia. Pupils are equal, round, and reactive to light 4 to 2 mm bilaterally, possible that the left pupil slightly larger than the right III,IV, VI: EOMI without ptosis or diploplia; though he has a clear right gaze preference he is able to fully bury to the left V: Facial sensation is symmetric to light touch VII: Facial movement is notable for subtle left facial droop VIII: hearing is intact to voice X: Uvula elevates symmetrically XII: tongue is midline without atrophy or fasciculations.  Motor: Tone is low in the left hand. Bulk is normal.  He is weakest in the left hand, 0-1/5, but proximally at least 4/5, 4/5 in the left leg Sensory: He intermittently reports a sensation on the left side.  He has markedly reduced reaction to noxious stimulus in the left leg and arm Plantars: Toes are downgoing bilaterally.  Cerebellar: FNF and HKS are intact bilaterally  NIHSS total 8 Score breakdown: Partial gaze palsy (1), complete hemianopia (2), mild left facial droop (1), left arm drift (1), partial sensory loss on the left (1), complete neglect of the left (2)  I have reviewed labs in epic and the results pertinent to this consultation are: Sodium 128 Potassium 2.8 Chloride 81 Glucose 165 BUN 28 Anion gap 18 Elevated AST 43, ALT 21 Albumin 2.8 Platelets  133 Elevated RDW 17.7 Elevated INR 1.3  I have reviewed the images obtained: Head CT aspects 3, with hypodensity in the right frontal and right parieto-occipital areas in a watershed pattern.  Petechial hemorrhage in the frontal stroke   Impression: This is a 35 year old male with minimal prior stroke risk factors presenting with an embolic stroke of unknown source  Recommendations:  #Right carotid thrombus with right MCA territory watershed strokes - patient emergently taken to interventional radiology with Dr. Estanislado Pandy.  Thrombus removed, no underlying vessel abnormality appreciated - Hypercoagulability work-up collected and pending - HIV, RPR, TSH, HgbA1c, fasting lipid panel, UA, UDS - MRI brain completed, as above  - CTA completed as above  - Frequent neuro checks - Echocardiogram, should include bubble study - Prophylactic therapy-Antiplatelet med: Aspirin - dose 325mg  PO or 300mg  PR, followed by 81 mg daily - Consider Plavix 300 mg load with 75 mg daily for  21 - 90 day course, or antiplatelet regimen per neuro interventional radiology if stent is placed - Risk factor modification - Telemetry monitoring; 30 day event monitor on discharge if no arrythmias captured  - Blood pressure goal   - Post successful uncomplicated revascularization SBP 120 - 140 for 24 hours; if complications have arisen or only partial revascularization reach out to interventionalist or neurologist on call for BP goal - PT consult, OT consult, Speech consult, unless patient is back to baseline - Stroke team to follow  #QTc prolongation, electrolyte derangements -Aggressive repletion of electrolytes -Avoid trazodone and other QTc prolonging medications  #Hyponatremia > Differential includes beer potomania, hypovolemic hyponatremia, SIADH -Work-up pending, serum osmolality, urine sodium, urine osmolality -Every 4 hours sodiums -Correct gradually, 6 to 8 mEq/day -Appreciate CCM assistance  #Evidence  of liver dysfunction, endorsement of alcohol use -Monitor for signs and symptoms of alcohol withdrawal -CK, GGT to clarify if AST elevation is related to liver or muscle injury (given unclear time on floor) -Continue to trend liver enzymes  #Goals of care Patient reported that in the event of decline he would want lifesaving measures to be pursued, should complications arise in his parents are his decision makers given that he is separated from his wife  Lesleigh Noe MD-PhD [facility] [phone]  115 minutes were spent in the critical care of this patient as above

## 2020-12-29 NOTE — Anesthesia Procedure Notes (Signed)
Procedure Name: Intubation Date/Time: 12/29/2020 2:09 PM Performed by: Lance Coon, CRNA Pre-anesthesia Checklist: Patient identified, Emergency Drugs available, Suction available, Patient being monitored and Timeout performed Patient Re-evaluated:Patient Re-evaluated prior to induction Oxygen Delivery Method: Circle system utilized Preoxygenation: Pre-oxygenation with 100% oxygen Induction Type: IV induction, Rapid sequence and Cricoid Pressure applied Laryngoscope Size: Glidescope and 4 Grade View: Grade I Tube type: Oral Tube size: 8.0 mm Number of attempts: 1 Airway Equipment and Method: Stylet and Video-laryngoscopy Placement Confirmation: ETT inserted through vocal cords under direct vision,  positive ETCO2 and breath sounds checked- equal and bilateral Secured at: 22 cm Tube secured with: Tape Dental Injury: Teeth and Oropharynx as per pre-operative assessment

## 2020-12-30 ENCOUNTER — Inpatient Hospital Stay (HOSPITAL_COMMUNITY): Payer: BC Managed Care – PPO

## 2020-12-30 ENCOUNTER — Encounter (HOSPITAL_COMMUNITY): Payer: Self-pay | Admitting: Radiology

## 2020-12-30 DIAGNOSIS — I63231 Cerebral infarction due to unspecified occlusion or stenosis of right carotid arteries: Secondary | ICD-10-CM

## 2020-12-30 DIAGNOSIS — F419 Anxiety disorder, unspecified: Secondary | ICD-10-CM

## 2020-12-30 DIAGNOSIS — R9431 Abnormal electrocardiogram [ECG] [EKG]: Secondary | ICD-10-CM

## 2020-12-30 DIAGNOSIS — I639 Cerebral infarction, unspecified: Secondary | ICD-10-CM | POA: Diagnosis not present

## 2020-12-30 DIAGNOSIS — I6389 Other cerebral infarction: Secondary | ICD-10-CM

## 2020-12-30 DIAGNOSIS — I63131 Cerebral infarction due to embolism of right carotid artery: Secondary | ICD-10-CM

## 2020-12-30 DIAGNOSIS — E876 Hypokalemia: Secondary | ICD-10-CM

## 2020-12-30 LAB — CBC WITH DIFFERENTIAL/PLATELET
Abs Immature Granulocytes: 0.5 10*3/uL — ABNORMAL HIGH (ref 0.00–0.07)
Basophils Absolute: 0 10*3/uL (ref 0.0–0.1)
Basophils Relative: 1 %
Eosinophils Absolute: 0 10*3/uL (ref 0.0–0.5)
Eosinophils Relative: 0 %
HCT: 34.4 % — ABNORMAL LOW (ref 39.0–52.0)
Hemoglobin: 12.5 g/dL — ABNORMAL LOW (ref 13.0–17.0)
Immature Granulocytes: 9 %
Lymphocytes Relative: 25 %
Lymphs Abs: 1.5 10*3/uL (ref 0.7–4.0)
MCH: 34.2 pg — ABNORMAL HIGH (ref 26.0–34.0)
MCHC: 36.3 g/dL — ABNORMAL HIGH (ref 30.0–36.0)
MCV: 94.2 fL (ref 80.0–100.0)
Monocytes Absolute: 0.6 10*3/uL (ref 0.1–1.0)
Monocytes Relative: 11 %
Neutro Abs: 3.2 10*3/uL (ref 1.7–7.7)
Neutrophils Relative %: 54 %
Platelets: 128 10*3/uL — ABNORMAL LOW (ref 150–400)
RBC: 3.65 MIL/uL — ABNORMAL LOW (ref 4.22–5.81)
RDW: 18 % — ABNORMAL HIGH (ref 11.5–15.5)
WBC: 5.8 10*3/uL (ref 4.0–10.5)
nRBC: 0 % (ref 0.0–0.2)

## 2020-12-30 LAB — HEMOGLOBIN A1C
Hgb A1c MFr Bld: 5.5 % (ref 4.8–5.6)
Mean Plasma Glucose: 111.15 mg/dL

## 2020-12-30 LAB — CARDIOLIPIN ANTIBODIES, IGG, IGM, IGA
Anticardiolipin IgA: <9 APL U/mL (ref 0–11)
Anticardiolipin IgG: <9 GPL U/mL (ref 0–14)
Anticardiolipin IgM: <9 MPL U/mL (ref 0–12)

## 2020-12-30 LAB — SODIUM
Sodium: 131 mmol/L — ABNORMAL LOW (ref 135–145)
Sodium: 131 mmol/L — ABNORMAL LOW (ref 135–145)

## 2020-12-30 LAB — ECHOCARDIOGRAM COMPLETE
Area-P 1/2: 2.95 cm2
Height: 73 in
S' Lateral: 3.05 cm
Weight: 3520 oz

## 2020-12-30 LAB — SURGICAL PATHOLOGY

## 2020-12-30 LAB — COMPREHENSIVE METABOLIC PANEL
ALT: 18 U/L (ref 0–44)
AST: 32 U/L (ref 15–41)
Albumin: 2.3 g/dL — ABNORMAL LOW (ref 3.5–5.0)
Alkaline Phosphatase: 61 U/L (ref 38–126)
Anion gap: 18 — ABNORMAL HIGH (ref 5–15)
BUN: 21 mg/dL — ABNORMAL HIGH (ref 6–20)
CO2: 25 mmol/L (ref 22–32)
Calcium: 7.1 mg/dL — ABNORMAL LOW (ref 8.9–10.3)
Chloride: 88 mmol/L — ABNORMAL LOW (ref 98–111)
Creatinine, Ser: 0.85 mg/dL (ref 0.61–1.24)
GFR, Estimated: >60 mL/min (ref >60)
Glucose, Bld: 165 mg/dL — ABNORMAL HIGH (ref 70–99)
Potassium: 2.7 mmol/L — CL (ref 3.5–5.1)
Sodium: 131 mmol/L — ABNORMAL LOW (ref 135–145)
Total Bilirubin: 1.5 mg/dL — ABNORMAL HIGH (ref 0.3–1.2)
Total Protein: 5.3 g/dL — ABNORMAL LOW (ref 6.5–8.1)

## 2020-12-30 LAB — LIPID PANEL
Cholesterol: 93 mg/dL (ref 0–200)
HDL: 12 mg/dL — ABNORMAL LOW (ref >40)
LDL Cholesterol: 51 mg/dL (ref 0–99)
Total CHOL/HDL Ratio: 7.8 RATIO
Triglycerides: 151 mg/dL — ABNORMAL HIGH (ref <150)
VLDL: 30 mg/dL (ref 0–40)

## 2020-12-30 LAB — BETA-2-GLYCOPROTEIN I ABS, IGG/M/A
Beta-2 Glyco I IgG: <9 GPI IgG units (ref 0–20)
Beta-2-Glycoprotein I IgA: <9 GPI IgA units (ref 0–25)
Beta-2-Glycoprotein I IgM: <9 GPI IgM units (ref 0–32)

## 2020-12-30 LAB — TSH: TSH: 1.158 u[IU]/mL (ref 0.350–4.500)

## 2020-12-30 LAB — HOMOCYSTEINE: Homocysteine: 243.4 umol/L — ABNORMAL HIGH (ref 0.0–14.5)

## 2020-12-30 LAB — OSMOLALITY: Osmolality: 280 mOsm/kg (ref 275–295)

## 2020-12-30 LAB — RPR: RPR Ser Ql: NONREACTIVE

## 2020-12-30 MED ORDER — NICOTINE 14 MG/24HR TD PT24
14.0000 mg | MEDICATED_PATCH | Freq: Every day | TRANSDERMAL | Status: DC
  Administered 2020-12-30 – 2021-01-03 (×5): 14 mg via TRANSDERMAL
  Filled 2020-12-30 (×5): qty 1

## 2020-12-30 MED ORDER — PANTOPRAZOLE SODIUM 40 MG PO TBEC
40.0000 mg | DELAYED_RELEASE_TABLET | Freq: Every day | ORAL | Status: DC
  Administered 2020-12-30 – 2021-01-03 (×5): 40 mg via ORAL
  Filled 2020-12-30 (×5): qty 1

## 2020-12-30 MED ORDER — POTASSIUM CHLORIDE CRYS ER 20 MEQ PO TBCR: 40.0000 meq | EXTENDED_RELEASE_TABLET | ORAL | Status: DC

## 2020-12-30 MED ORDER — POTASSIUM CHLORIDE CRYS ER 20 MEQ PO TBCR
40.0000 meq | EXTENDED_RELEASE_TABLET | Freq: Once | ORAL | Status: AC
  Administered 2020-12-30: 40 meq via ORAL
  Filled 2020-12-30: qty 2

## 2020-12-30 MED ORDER — CLONAZEPAM 0.5 MG PO TABS
0.5000 mg | ORAL_TABLET | Freq: Two times a day (BID) | ORAL | Status: DC | PRN
  Administered 2020-12-30 – 2021-01-03 (×6): 0.5 mg via ORAL
  Filled 2020-12-30 (×6): qty 1

## 2020-12-30 MED ORDER — TRAZODONE HCL 50 MG PO TABS: 150.0000 mg | ORAL_TABLET | Freq: Every day | ORAL | Status: DC

## 2020-12-30 MED ORDER — POTASSIUM CHLORIDE 10 MEQ/100ML IV SOLN
10.0000 meq | INTRAVENOUS | Status: AC
  Administered 2020-12-30 (×4): 10 meq via INTRAVENOUS
  Filled 2020-12-30 (×4): qty 100

## 2020-12-30 MED ORDER — VENLAFAXINE HCL ER 150 MG PO CP24
150.0000 mg | ORAL_CAPSULE | Freq: Every day | ORAL | Status: DC
Start: 2020-12-30 — End: 2020-12-30

## 2020-12-30 MED ORDER — HYDROXYZINE HCL 50 MG PO TABS: 100.0000 mg | ORAL_TABLET | Freq: Every day | ORAL | Status: DC

## 2020-12-30 MED ORDER — VENLAFAXINE HCL ER 150 MG PO CP24
150.0000 mg | ORAL_CAPSULE | Freq: Every day | ORAL | Status: DC
  Filled 2020-12-30: qty 1

## 2020-12-30 MED ORDER — CHLORHEXIDINE GLUCONATE CLOTH 2 % EX PADS
6.0000 | MEDICATED_PAD | Freq: Every day | CUTANEOUS | Status: DC
  Administered 2021-01-01 – 2021-01-02 (×2): 6 via TOPICAL

## 2020-12-30 MED ORDER — ATORVASTATIN CALCIUM 10 MG PO TABS
20.0000 mg | ORAL_TABLET | Freq: Every day | ORAL | Status: DC
  Administered 2020-12-30 – 2021-01-03 (×5): 20 mg via ORAL
  Filled 2020-12-30 (×5): qty 2

## 2020-12-30 MED ORDER — VITAMIN D 25 MCG (1000 UNIT) PO TABS
1000.0000 [IU] | ORAL_TABLET | Freq: Every day | ORAL | Status: DC
  Administered 2020-12-30 – 2021-01-03 (×5): 1000 [IU] via ORAL
  Filled 2020-12-30 (×5): qty 1

## 2020-12-30 MED ORDER — ENOXAPARIN SODIUM 40 MG/0.4ML ~~LOC~~ SOLN
40.0000 mg | SUBCUTANEOUS | Status: DC
  Administered 2020-12-30 – 2021-01-03 (×5): 40 mg via SUBCUTANEOUS
  Filled 2020-12-30 (×5): qty 0.4

## 2020-12-30 MED ORDER — TRAZODONE HCL 50 MG PO TABS: 100.0000 mg | ORAL_TABLET | Freq: Every day | ORAL | Status: DC

## 2020-12-30 MED ORDER — ASPIRIN EC 325 MG PO TBEC
325.0000 mg | DELAYED_RELEASE_TABLET | Freq: Every day | ORAL | Status: DC
  Administered 2020-12-31 – 2021-01-03 (×4): 325 mg via ORAL
  Filled 2020-12-30 (×4): qty 1

## 2020-12-30 NOTE — Progress Notes (Addendum)
STROKE TEAM PROGRESS NOTE   SUBJECTIVE (INTERVAL HISTORY) His mom is at the bedside.  Overall his condition is rapidly improving. Pt was extubated, awake alert, interactive, still has left facial and left arm mild weakness. Able to walk with PT/OT in the hallway. Pt has significantly prolonged QT interval, likely due to his medication of hydroxyzine and trazodone, as well as effexor. Will hold off those meds. klonopine PRN. TCD bubble study negative.     OBJECTIVE Temp:  [97.1 F (36.2 C)-99 F (37.2 C)] 97.9 F (36.6 C) (01/28 0800) Pulse Rate:  [68-99] 77 (01/28 1000) Cardiac Rhythm: Normal sinus rhythm (01/28 0900) Resp:  [11-31] 28 (01/28 1000) BP: (116-152)/(74-106) 116/90 (01/28 1000) SpO2:  [91 %-100 %] 93 % (01/28 1000) Arterial Line BP: (122-169)/(65-86) 136/83 (01/28 1000) Weight:  [99.8 kg] 99.8 kg (01/27 1249)  No results for input(s): GLUCAP in the last 168 hours. Recent Labs  Lab 12/29/20 1222 12/29/20 1229 12/29/20 1317 12/29/20 2030 12/29/20 2316 12/30/20 0316  NA 128* 125*  --  130* 131* 131*  K 2.8* 3.6  --   --   --  2.7*  CL 81* 80*  --   --   --  88*  CO2 29  --   --   --   --  25  GLUCOSE 165* 163*  --   --   --  165*  BUN 28* 42*  --   --   --  21*  CREATININE 1.11 0.80  --   --   --  0.85  CALCIUM 7.9*  --   --   --   --  7.1*  MG  --   --  2.0  --   --   --    Recent Labs  Lab 12/29/20 1222 12/30/20 0316  AST 43* 32  ALT 21 18  ALKPHOS 79 61  BILITOT 2.2* 1.5*  PROT 6.3* 5.3*  ALBUMIN 2.8* 2.3*   Recent Labs  Lab 12/29/20 1222 12/29/20 1229 12/30/20 0316  WBC 7.0  --  5.8  NEUTROABS 4.8  --  3.2  HGB 15.2 16.3 12.5*  HCT 43.2 48.0 34.4*  MCV 94.1  --  94.2  PLT 133*  --  128*   Recent Labs  Lab 12/29/20 2030  CKTOTAL 43*   Recent Labs    12/29/20 1222  LABPROT 15.8*  INR 1.3*   No results for input(s): COLORURINE, LABSPEC, PHURINE, GLUCOSEU, HGBUR, BILIRUBINUR, KETONESUR, PROTEINUR, UROBILINOGEN, NITRITE, LEUKOCYTESUR in  the last 72 hours.  Invalid input(s): APPERANCEUR     Component Value Date/Time   CHOL 93 12/30/2020 0316   TRIG 151 (H) 12/30/2020 0316   HDL 12 (L) 12/30/2020 0316   CHOLHDL 7.8 12/30/2020 0316   VLDL 30 12/30/2020 0316   LDLCALC 51 12/30/2020 0316   Lab Results  Component Value Date   HGBA1C 5.5 12/30/2020      Component Value Date/Time   LABOPIA NONE DETECTED 02/19/2012 1014   COCAINSCRNUR NONE DETECTED 02/19/2012 1014   LABBENZ NONE DETECTED 02/19/2012 1014   AMPHETMU NONE DETECTED 02/19/2012 1014   THCU NONE DETECTED 02/19/2012 1014   LABBARB NONE DETECTED 02/19/2012 1014    Recent Labs  Lab 12/29/20 1222  ETH <10    I have personally reviewed the radiological images below and agree with the radiology interpretations.  CT Code Stroke CTA Head W/WO contrast  Result Date: 12/29/2020 CLINICAL DATA:  Left-sided weakness.  Stroke suspected. EXAM: CT ANGIOGRAPHY HEAD AND  NECK TECHNIQUE: Multidetector CT imaging of the head and neck was performed using the standard protocol during bolus administration of intravenous contrast. Multiplanar CT image reconstructions and MIPs were obtained to evaluate the vascular anatomy. Carotid stenosis measurements (when applicable) are obtained utilizing NASCET criteria, using the distal internal carotid diameter as the denominator. CONTRAST:  70mL OMNIPAQUE IOHEXOL 350 MG/ML SOLN COMPARISON:  Head CT December 29, 2020. FINDINGS: CTA NECK FINDINGS Aortic arch: Standard branching. Imaged portion shows no evidence of aneurysm or dissection. No significant stenosis of the major arch vessel origins. Right carotid system: A mural thrombus is seen in the right carotid bulb extending superiorly along the proximal and mid cervical segment of the right ICA resulting in severe stenosis. Left carotid system: Mild atherosclerotic changes. No evidence of dissection, stenosis (50% or greater) or occlusion. Vertebral arteries: Codominant. No evidence of dissection,  stenosis (50% or greater) or occlusion. Skeleton: No acute findings. Other neck: Heterogeneous thyroid gland. Upper chest: Negative Review of the MIP images confirms the above findings CTA HEAD FINDINGS Anterior circulation: Intracranial right ICA is patent. Luminal irregularity at the right distal M2/M3/MCA may represent subocclusive clot. The bilateral ACA and the left MCA vascular trees are patent. Posterior circulation: No significant stenosis, proximal occlusion, aneurysm, or vascular malformation. Venous sinuses: As permitted by contrast timing, patent. Review of the MIP images confirms the above findings IMPRESSION: 1. Thrombus in the right carotid bulb extending superiorly along the proximal and mid cervical segment of the right ICA resulting in severe stenosis. 2. Luminal irregularity at the right distal M2/M3/MCA may represent subocclusive clot. These results were called by telephone at the time of interpretation on 12/29/2020 at 1:10 pm to provider [facility] , who verbally acknowledged these results. Electronically Signed   By: Pedro Earls M.D.   On: 12/29/2020 13:19   CT HEAD WO CONTRAST  Result Date: 12/29/2020 CLINICAL DATA:  Stroke. Neuro deficit, acute, stroke suspected. Post IR thrombectomy. EXAM: CT HEAD WITHOUT CONTRAST TECHNIQUE: Contiguous axial images were obtained from the base of the skull through the vertex without intravenous contrast. COMPARISON:  Brain MRI 12/29/2020, CT angiogram head/neck 12/29/2020, noncontrast head CT 12/29/2020. FINDINGS: Brain: Redemonstrated patchy acute cortical and subcortical infarcts within the right frontal, parietal, occipital and temporal lobes within the right MCA territory and right MCA/ACA and MCA/PCA watershed territories. These infarcts do not appear significantly changed in extent as compared to the brain MRI performed earlier today and are again overall moderate in volume. Unchanged subtle petechial hemorrhage at site of an  acute infarct within the right frontal lobe (series 3, image 20) and right occipital lobe (series 3, image 15). No frank hemorrhagic conversion. No significant mass effect. Background mild ill-defined hypoattenuation within the cerebral white matter is nonspecific, but compatible with chronic small vessel ischemic disease. No extra-axial fluid collection. No evidence of intracranial mass. No midline shift. Vascular: Residual intravascular contrast limits evaluation for hyperdense vessels. Skull: Normal. Negative for fracture or focal lesion. Sinuses/Orbits: Visualized orbits show no acute finding. Mild bilateral ethmoid sinus mucosal thickening. IMPRESSION: Patchy acute cortical and subcortical infarcts, within the right cerebral hemisphere in the right MCA territory and right MCA/ACA and MCA/PCA watershed territories, not significantly changed in extent from the brain MRI performed earlier today. Unchanged subtle petechial hemorrhage at sites of acute infarct within the right frontal and occipital lobes. No significant mass effect. Background mild chronic small vessel ischemic disease. Mild ethmoid sinus mucosal thickening. Electronically Signed   By: Marylyn Ishihara  Golden DO   On: 12/29/2020 16:32   CT Code Stroke CTA Neck W/WO contrast  Result Date: 12/29/2020 CLINICAL DATA:  Left-sided weakness.  Stroke suspected. EXAM: CT ANGIOGRAPHY HEAD AND NECK TECHNIQUE: Multidetector CT imaging of the head and neck was performed using the standard protocol during bolus administration of intravenous contrast. Multiplanar CT image reconstructions and MIPs were obtained to evaluate the vascular anatomy. Carotid stenosis measurements (when applicable) are obtained utilizing NASCET criteria, using the distal internal carotid diameter as the denominator. CONTRAST:  38mL OMNIPAQUE IOHEXOL 350 MG/ML SOLN COMPARISON:  Head CT December 29, 2020. FINDINGS: CTA NECK FINDINGS Aortic arch: Standard branching. Imaged portion shows no evidence  of aneurysm or dissection. No significant stenosis of the major arch vessel origins. Right carotid system: A mural thrombus is seen in the right carotid bulb extending superiorly along the proximal and mid cervical segment of the right ICA resulting in severe stenosis. Left carotid system: Mild atherosclerotic changes. No evidence of dissection, stenosis (50% or greater) or occlusion. Vertebral arteries: Codominant. No evidence of dissection, stenosis (50% or greater) or occlusion. Skeleton: No acute findings. Other neck: Heterogeneous thyroid gland. Upper chest: Negative Review of the MIP images confirms the above findings CTA HEAD FINDINGS Anterior circulation: Intracranial right ICA is patent. Luminal irregularity at the right distal M2/M3/MCA may represent subocclusive clot. The bilateral ACA and the left MCA vascular trees are patent. Posterior circulation: No significant stenosis, proximal occlusion, aneurysm, or vascular malformation. Venous sinuses: As permitted by contrast timing, patent. Review of the MIP images confirms the above findings IMPRESSION: 1. Thrombus in the right carotid bulb extending superiorly along the proximal and mid cervical segment of the right ICA resulting in severe stenosis. 2. Luminal irregularity at the right distal M2/M3/MCA may represent subocclusive clot. These results were called by telephone at the time of interpretation on 12/29/2020 at 1:10 pm to provider [facility] , who verbally acknowledged these results. Electronically Signed   By: Pedro Earls M.D.   On: 12/29/2020 13:19   MR BRAIN WO CONTRAST  Result Date: 12/29/2020 CLINICAL DATA:  Neuro deficit, acute, stroke suspected. EXAM: MRI HEAD WITHOUT CONTRAST TECHNIQUE: Multiplanar, multiecho pulse sequences of the brain and surrounding structures were obtained without intravenous contrast. COMPARISON:  Noncontrast head CT and CT angiogram head/neck performed earlier today 12/29/2020. FINDINGS:  Brain: Cerebral volume is normal for age. There are patchy cortical/subcortical infarcts within the right cerebral hemisphere within the right frontal, parietal, occipital and temporal lobes as well as right insula. These acute infarcts are present within the right MCA territory as well as MCA/PCA and MCA/ACA watershed territories. These infarcts overall moderate in volume. No significant mass effect at this time. There is mild SWI signal loss and T1 hyperintensity associated with acute infarcts within the right frontal and occipital lobes consistent with petechial hemorrhage. Background mild multifocal T2/FLAIR hyperintensity within the cerebral white matter is nonspecific, but compatible with chronic small vessel ischemic disease. No evidence of intracranial mass. No extra-axial fluid collection. No midline shift. Vascular: Expected proximal arterial flow voids. Skull and upper cervical spine: No focal marrow lesion. Sinuses/Orbits: Visualized orbits show no acute finding. No significant paranasal sinus disease at the imaged levels. IMPRESSION: Patchy acute cortical/subcortical infarcts within the right cerebral hemisphere within the right MCA territory as well as MCA/PCA and MCA/ACA watershed territories. The infarcts are overall moderate in volume. No significant mass effect at this time. Petechial hemorrhage associated with an acute infarcts in the right frontal and  right occipital lobes. Background mild chronic small vessel ischemic disease. Electronically Signed   By: Kellie Simmering DO   On: 12/29/2020 14:20   CT HEAD CODE STROKE WO CONTRAST  Result Date: 12/29/2020 CLINICAL DATA:  Code stroke.  Left-sided weakness. EXAM: CT HEAD WITHOUT CONTRAST TECHNIQUE: Contiguous axial images were obtained from the base of the skull through the vertex without intravenous contrast. COMPARISON:  None. FINDINGS: Brain: No abnormality affects the brainstem or cerebellum. The left cerebral hemisphere is normal except for a  questionable old white matter infarction in the left parietal region. On the right, there is evidence of acute infarction within the middle cerebral artery territory. Cortical and subcortical low-density seen at the margins of the MCA distribution, with diminished gray-white differentiation elsewhere within the MCA territory. Minimal petechial blood products in the area the right frontal portion of the infarction. No mass effect. No hydrocephalus. Vascular: No hyperdense vessel. Skull: Normal Sinuses/Orbits: Clear/normal Other: None ASPECTS ([place] Stroke Program Early CT Score) - Ganglionic level infarction (caudate, lentiform nuclei, internal capsule, insula, M1-M3 cortex): 3 - Supraganglionic infarction (M4-M6 cortex): 0 Total score (0-10 with 10 being normal): 3 IMPRESSION: Acute infarction in the right middle cerebral artery territory. Cortical and subcortical low-density at the margins of the infarction, with diminished gray-white differentiation elsewhere within the MCA territory. Minimal petechial blood products in the area of the right frontal portion of the infarction. No mass effect. Aspects score 3. These results were communicated to Dr. Curly Shores At 12:37 pmon 1/27/2022by text page via the [facility] messaging system. Electronically Signed   By: Nelson Chimes M.D.   On: 12/29/2020 12:38    PHYSICAL EXAM  Temp:  [97.1 F (36.2 C)-99 F (37.2 C)] 97.9 F (36.6 C) (01/28 0800) Pulse Rate:  [68-99] 77 (01/28 1000) Resp:  [11-31] 28 (01/28 1000) BP: (116-152)/(74-106) 116/90 (01/28 1000) SpO2:  [91 %-100 %] 93 % (01/28 1000) Arterial Line BP: (122-169)/(65-86) 136/83 (01/28 1000) Weight:  [99.8 kg] 99.8 kg (01/27 1249)  General - Well nourished, well developed, in no apparent distress.  Ophthalmologic - fundi not visualized due to noncooperation.  Cardiovascular - Regular rhythm and rate.  Mental Status -  Level of arousal and orientation to time, place, and person were intact. Language  including expression, naming, repetition, comprehension was assessed and found intact.  Cranial Nerves II - XII - II - Visual field intact OU. III, IV, VI - Extraocular movements intact, mild right gaze preference. V - Facial sensation intact bilaterally. VII - mild right facial weakness. VIII - Hearing & vestibular intact bilaterally. X - Palate elevates symmetrically. XI - Chin turning & shoulder shrug intact bilaterally. XII - Tongue protrusion intact.  Motor Strength - The patient's strength was normal in right upper and lower extremities, however, left UE proximal 4/5, distal wrist extension 2/5 and finger grip 2/5. Left LE 4+/5 proximal and 4-/5 ankle PF/DF.  Bulk was normal and fasciculations were absent.   Motor Tone - Muscle tone was assessed at the neck and appendages and was normal.  Reflexes - The patient's reflexes were symmetrical in all extremities and he had no pathological reflexes.  Sensory - Light touch, temperature/pinprick were assessed and were symmetrical.    Coordination - The patient had normal movements in the hands with no ataxia or dysmetria.  Tremor was absent.  Gait and Station - deferred.   ASSESSMENT/PLAN [name] is a 35 y.o. male with history of remote cocaine use, HTN, anxiety and depression  admitted for found down with right gaze and left weakness. No tPA given due to unclear time onset. S/p IR for right ICA thrombus.     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 Luminal irregularity at the right distal M2/M3/MCA may represent  subocclusive clot.  MRI right MCA scattered  patchy infarcts with petechial hemorrhage  CT repeat - Patchy acute cortical and subcortical infarcts, within the right cerebral hemisphere in the right MCA territory and right MCA/ACA and MCA/PCA watershed territories  2D Echo EF 55-60%  TCD bubble study no PFO  LE venous doppler - no DVT  LDL 51  HgbA1c 5.5  Hypercoagulable work up pending  lovenox for VTE prophylaxis  No antithrombotic prior to admission, now on aspirin 325 mg daily. No DAPT now given petechial hemorrhage seen on CT.   Ongoing aggressive stroke risk factor management  Therapy recommendations:  CIR  Disposition:  pending  QT prolongation  Likely acquired from his home medication including hydroxyzine, trazodone and effexor  QTc ranging from 574->634  In 2013 only 416  Hold off home meds for now  Anxiety and depression  Lately getting worse for the last 2 weeks  PCP increased effexor from 100->150  Also on home hydroxyzine and trazodone  Currently all home meds on hold due to QTc prolongation  Pt not able to have buspar given prior side effects  Add clonazepam 0.5mg  bid  Hypertension . Stable . BP goal < 160 given petechial hemorrhage  Long term BP goal normotensive  Hyperlipidemia  Home meds:  none   LDL 51, goal < 70  Now on lipitor 20 - no high intensity given LDL at goal  Continue statin at discharge  Tobacco abuse  Current smoker  Smoking cessation counseling provided  Nicotine patch provided  Pt is willing to quit  Other Stroke Risk Factors  Brother died at age of 47 due to enlarged heart with ? clot  Remote cocaine use - sober since age 65  Other Active Problems  Hypokalemia - K 2.7-> supplement  Hyponatremia Na 128->125->130->131  Mild thrombocytopenia - platelet 128  Hospital day # 1  This patient is critically ill due to right MCA stroke with right ICA thrombus s/p thrombectomy, QTc prolongation and at significant risk of neurological worsening,  death form recurrent stroke, hemorrhagic conversion, cardiac arrhythmia, cardiac arrest, seizure. This patient's care requires constant monitoring of vital signs, hemodynamics, respiratory and cardiac monitoring, review of multiple databases, neurological assessment, discussion with family, other specialists and medical decision making of high complexity. I spent 40 minutes of neurocritical care time in the care of this patient. I had long discussion with pt and mom at bedside, updated pt current condition, treatment plan and potential prognosis, and answered all the questions. They expressed understanding and appreciation.     Rosalin Hawking, MD PhD Stroke Neurology 12/30/2020 12:14 PM    To contact Stroke Continuity provider, please refer to [email or web address] After hours, contact General Neurology

## 2020-12-30 NOTE — Progress Notes (Signed)
Referring Physician(s): Code stroke- Bhagat, Srishti L (neurology)  Supervising Physician: Luanne Bras  Patient Status:  [facility] - In-pt  Chief Complaint: Stroke F/U  Subjective:  History of acute CVA s/p cerebral arteriogram with emergent mechanical thrombectomy of right ICA thrombus achieving a TICI 3 revascularization via right femoral approach 12/29/2020 by Dr. Estanislado Pandy. Patient awake and alert sitting in bed watching TV. Can spontaneously move all extremities. Demonstrates left eye visual field deficit. Right femoral puncture site c/d/i.   Allergies: Penicillins  Medications: Prior to Admission medications   Medication Sig Start Date End Date Taking? Authorizing Provider  hydrOXYzine (ATARAX/VISTARIL) 50 MG tablet Take 100 mg by mouth daily. 11/15/15  Yes [provider]  traZODone (DESYREL) 100 MG tablet Take 150 mg by mouth at bedtime. 11/03/20  Yes [provider]  venlafaxine XR (EFFEXOR-XR) 75 MG 24 hr capsule Take 150 mg by mouth daily. 10/29/20  Yes [provider]  lisinopril (PRINIVIL,ZESTRIL) 20 MG tablet TAKE 1 TABLET (20 MG TOTAL) BY MOUTH DAILY. Patient not taking: No sig reported 01/11/17   Dorothyann Peng, NP     Vital Signs: BP 120/76   Pulse 71   Temp 97.9 F (36.6 C) (Oral)   Resp 18   Ht 6\' 1"  (1.854 m)   Wt 220 lb (99.8 kg)   SpO2 99%   BMI 29.03 kg/m   Physical Exam Vitals and nursing note reviewed.  Constitutional:      General: He is not in acute distress.    Appearance: Normal appearance.  Pulmonary:     Effort: Pulmonary effort is normal. No respiratory distress.  Skin:    General: Skin is warm and dry.     Comments: Right femoral puncture site soft without active bleeding or hematoma.  Neurological:     Mental Status: He is alert.     Comments: Alert, awake, and oriented x3. Speech and comprehension intact. PERRL bilaterally. EOMs intact bilaterally without nystagmus or subjective  diplopia. Demonstrates left eye visual field deficit. No facial asymmetry. Tongue midline. Can spontaneously move all extremities. No pronator drift. Distal pulses (DPs) palpable bilaterally with Doppler.     Imaging: CT Code Stroke CTA Head W/WO contrast  Result Date: 12/29/2020 CLINICAL DATA:  Left-sided weakness.  Stroke suspected. EXAM: CT ANGIOGRAPHY HEAD AND NECK TECHNIQUE: Multidetector CT imaging of the head and neck was performed using the standard protocol during bolus administration of intravenous contrast. Multiplanar CT image reconstructions and MIPs were obtained to evaluate the vascular anatomy. Carotid stenosis measurements (when applicable) are obtained utilizing NASCET criteria, using the distal internal carotid diameter as the denominator. CONTRAST:  65mL OMNIPAQUE IOHEXOL 350 MG/ML SOLN COMPARISON:  Head CT December 29, 2020. FINDINGS: CTA NECK FINDINGS Aortic arch: Standard branching. Imaged portion shows no evidence of aneurysm or dissection. No significant stenosis of the major arch vessel origins. Right carotid system: A mural thrombus is seen in the right carotid bulb extending superiorly along the proximal and mid cervical segment of the right ICA resulting in severe stenosis. Left carotid system: Mild atherosclerotic changes. No evidence of dissection, stenosis (50% or greater) or occlusion. Vertebral arteries: Codominant. No evidence of dissection, stenosis (50% or greater) or occlusion. Skeleton: No acute findings. Other neck: Heterogeneous thyroid gland. Upper chest: Negative Review of the MIP images confirms the above findings CTA HEAD FINDINGS Anterior circulation: Intracranial right ICA is patent. Luminal irregularity at the right distal M2/M3/MCA may represent subocclusive clot. The bilateral ACA and the left MCA vascular  trees are patent. Posterior circulation: No significant stenosis, proximal occlusion, aneurysm, or vascular malformation. Venous sinuses: As permitted  by contrast timing, patent. Review of the MIP images confirms the above findings IMPRESSION: 1. Thrombus in the right carotid bulb extending superiorly along the proximal and mid cervical segment of the right ICA resulting in severe stenosis. 2. Luminal irregularity at the right distal M2/M3/MCA may represent subocclusive clot. These results were called by telephone at the time of interpretation on 12/29/2020 at 1:10 pm to provider [facility] , who verbally acknowledged these results. Electronically Signed   By: Pedro Earls M.D.   On: 12/29/2020 13:19   CT HEAD WO CONTRAST  Result Date: 12/29/2020 CLINICAL DATA:  Stroke. Neuro deficit, acute, stroke suspected. Post IR thrombectomy. EXAM: CT HEAD WITHOUT CONTRAST TECHNIQUE: Contiguous axial images were obtained from the base of the skull through the vertex without intravenous contrast. COMPARISON:  Brain MRI 12/29/2020, CT angiogram head/neck 12/29/2020, noncontrast head CT 12/29/2020. FINDINGS: Brain: Redemonstrated patchy acute cortical and subcortical infarcts within the right frontal, parietal, occipital and temporal lobes within the right MCA territory and right MCA/ACA and MCA/PCA watershed territories. These infarcts do not appear significantly changed in extent as compared to the brain MRI performed earlier today and are again overall moderate in volume. Unchanged subtle petechial hemorrhage at site of an acute infarct within the right frontal lobe (series 3, image 20) and right occipital lobe (series 3, image 15). No frank hemorrhagic conversion. No significant mass effect. Background mild ill-defined hypoattenuation within the cerebral white matter is nonspecific, but compatible with chronic small vessel ischemic disease. No extra-axial fluid collection. No evidence of intracranial mass. No midline shift. Vascular: Residual intravascular contrast limits evaluation for hyperdense vessels. Skull: Normal. Negative for fracture or focal  lesion. Sinuses/Orbits: Visualized orbits show no acute finding. Mild bilateral ethmoid sinus mucosal thickening. IMPRESSION: Patchy acute cortical and subcortical infarcts, within the right cerebral hemisphere in the right MCA territory and right MCA/ACA and MCA/PCA watershed territories, not significantly changed in extent from the brain MRI performed earlier today. Unchanged subtle petechial hemorrhage at sites of acute infarct within the right frontal and occipital lobes. No significant mass effect. Background mild chronic small vessel ischemic disease. Mild ethmoid sinus mucosal thickening. Electronically Signed   By: Kellie Simmering DO   On: 12/29/2020 16:32   CT Code Stroke CTA Neck W/WO contrast  Result Date: 12/29/2020 CLINICAL DATA:  Left-sided weakness.  Stroke suspected. EXAM: CT ANGIOGRAPHY HEAD AND NECK TECHNIQUE: Multidetector CT imaging of the head and neck was performed using the standard protocol during bolus administration of intravenous contrast. Multiplanar CT image reconstructions and MIPs were obtained to evaluate the vascular anatomy. Carotid stenosis measurements (when applicable) are obtained utilizing NASCET criteria, using the distal internal carotid diameter as the denominator. CONTRAST:  71mL OMNIPAQUE IOHEXOL 350 MG/ML SOLN COMPARISON:  Head CT December 29, 2020. FINDINGS: CTA NECK FINDINGS Aortic arch: Standard branching. Imaged portion shows no evidence of aneurysm or dissection. No significant stenosis of the major arch vessel origins. Right carotid system: A mural thrombus is seen in the right carotid bulb extending superiorly along the proximal and mid cervical segment of the right ICA resulting in severe stenosis. Left carotid system: Mild atherosclerotic changes. No evidence of dissection, stenosis (50% or greater) or occlusion. Vertebral arteries: Codominant. No evidence of dissection, stenosis (50% or greater) or occlusion. Skeleton: No acute findings. Other neck:  Heterogeneous thyroid gland. Upper chest: Negative Review of the MIP  images confirms the above findings CTA HEAD FINDINGS Anterior circulation: Intracranial right ICA is patent. Luminal irregularity at the right distal M2/M3/MCA may represent subocclusive clot. The bilateral ACA and the left MCA vascular trees are patent. Posterior circulation: No significant stenosis, proximal occlusion, aneurysm, or vascular malformation. Venous sinuses: As permitted by contrast timing, patent. Review of the MIP images confirms the above findings IMPRESSION: 1. Thrombus in the right carotid bulb extending superiorly along the proximal and mid cervical segment of the right ICA resulting in severe stenosis. 2. Luminal irregularity at the right distal M2/M3/MCA may represent subocclusive clot. These results were called by telephone at the time of interpretation on 12/29/2020 at 1:10 pm to provider [facility] , who verbally acknowledged these results. Electronically Signed   By: Pedro Earls M.D.   On: 12/29/2020 13:19   MR BRAIN WO CONTRAST  Result Date: 12/29/2020 CLINICAL DATA:  Neuro deficit, acute, stroke suspected. EXAM: MRI HEAD WITHOUT CONTRAST TECHNIQUE: Multiplanar, multiecho pulse sequences of the brain and surrounding structures were obtained without intravenous contrast. COMPARISON:  Noncontrast head CT and CT angiogram head/neck performed earlier today 12/29/2020. FINDINGS: Brain: Cerebral volume is normal for age. There are patchy cortical/subcortical infarcts within the right cerebral hemisphere within the right frontal, parietal, occipital and temporal lobes as well as right insula. These acute infarcts are present within the right MCA territory as well as MCA/PCA and MCA/ACA watershed territories. These infarcts overall moderate in volume. No significant mass effect at this time. There is mild SWI signal loss and T1 hyperintensity associated with acute infarcts within the right frontal and  occipital lobes consistent with petechial hemorrhage. Background mild multifocal T2/FLAIR hyperintensity within the cerebral white matter is nonspecific, but compatible with chronic small vessel ischemic disease. No evidence of intracranial mass. No extra-axial fluid collection. No midline shift. Vascular: Expected proximal arterial flow voids. Skull and upper cervical spine: No focal marrow lesion. Sinuses/Orbits: Visualized orbits show no acute finding. No significant paranasal sinus disease at the imaged levels. IMPRESSION: Patchy acute cortical/subcortical infarcts within the right cerebral hemisphere within the right MCA territory as well as MCA/PCA and MCA/ACA watershed territories. The infarcts are overall moderate in volume. No significant mass effect at this time. Petechial hemorrhage associated with an acute infarcts in the right frontal and right occipital lobes. Background mild chronic small vessel ischemic disease. Electronically Signed   By: Kellie Simmering DO   On: 12/29/2020 14:20   CT HEAD CODE STROKE WO CONTRAST  Result Date: 12/29/2020 CLINICAL DATA:  Code stroke.  Left-sided weakness. EXAM: CT HEAD WITHOUT CONTRAST TECHNIQUE: Contiguous axial images were obtained from the base of the skull through the vertex without intravenous contrast. COMPARISON:  None. FINDINGS: Brain: No abnormality affects the brainstem or cerebellum. The left cerebral hemisphere is normal except for a questionable old white matter infarction in the left parietal region. On the right, there is evidence of acute infarction within the middle cerebral artery territory. Cortical and subcortical low-density seen at the margins of the MCA distribution, with diminished gray-white differentiation elsewhere within the MCA territory. Minimal petechial blood products in the area the right frontal portion of the infarction. No mass effect. No hydrocephalus. Vascular: No hyperdense vessel. Skull: Normal Sinuses/Orbits: Clear/normal  Other: None ASPECTS ([place] Stroke Program Early CT Score) - Ganglionic level infarction (caudate, lentiform nuclei, internal capsule, insula, M1-M3 cortex): 3 - Supraganglionic infarction (M4-M6 cortex): 0 Total score (0-10 with 10 being normal): 3 IMPRESSION: Acute infarction in the right  middle cerebral artery territory. Cortical and subcortical low-density at the margins of the infarction, with diminished gray-white differentiation elsewhere within the MCA territory. Minimal petechial blood products in the area of the right frontal portion of the infarction. No mass effect. Aspects score 3. These results were communicated to Dr. Curly Shores At 12:37 pmon 1/27/2022by text page via the [facility] messaging system. Electronically Signed   By: Nelson Chimes M.D.   On: 12/29/2020 12:38    Labs:  CBC: Recent Labs    12/29/20 1222 12/29/20 1229 12/30/20 0316  WBC 7.0  --  5.8  HGB 15.2 16.3 12.5*  HCT 43.2 48.0 34.4*  PLT 133*  --  128*    COAGS: Recent Labs    12/29/20 1222  INR 1.3*  APTT 27    BMP: Recent Labs    12/29/20 1222 12/29/20 1229 12/29/20 2030 12/29/20 2316 12/30/20 0316  NA 128* 125* 130* 131* 131*  K 2.8* 3.6  --   --  2.7*  CL 81* 80*  --   --  88*  CO2 29  --   --   --  25  GLUCOSE 165* 163*  --   --  165*  BUN 28* 42*  --   --  21*  CALCIUM 7.9*  --   --   --  7.1*  CREATININE 1.11 0.80  --   --  0.85  GFRNONAA >60  --   --   --  >60    LIVER FUNCTION TESTS: Recent Labs    12/29/20 1222 12/30/20 0316  BILITOT 2.2* 1.5*  AST 43* 32  ALT 21 18  ALKPHOS 79 61  PROT 6.3* 5.3*  ALBUMIN 2.8* 2.3*    Assessment and Plan:  History of acute CVA s/p cerebral arteriogram with emergent mechanical thrombectomy of right ICA thrombus achieving a TICI 3 revascularization via right femoral approach 12/29/2020 by Dr. Estanislado Pandy. Patient's condition stable- awake and alert following simple commands, speech clear, moving all extremities, demonstrates left eye visual field  deficit. Right femoral puncture site stable, distal pulses (DPs) palpable bilaterally with Doppler. Awaiting pathology results of thrombus. Further plans per neurology- appreciate and agree with management. Please call NIR with questions/concerns.   Electronically Signed: Earley Abide, PA-C 12/30/2020, 9:33 AM   I spent a total of 25 Minutes at the the patient's bedside AND on the patient's hospital floor or unit, greater than 50% of which was counseling/coordinating care for CVA s/p revascularization.

## 2020-12-30 NOTE — Progress Notes (Signed)
CRITICAL VALUE ALERT  Critical Value:  Potassium 2.7  Date & Time Notied:  12/30/20 0615  Provider Notified: Lucile Shutters  Orders Received/Actions taken: Awaiting orders

## 2020-12-30 NOTE — Progress Notes (Signed)
NAME:  Antonio Rogers, MRN:  [id number], DOB:  04-02-1986, LOS: 1 ADMISSION DATE:  12/29/2020, CONSULTATION DATE:  12/30/2020  REFERRING MD:  Curly Shores, MD, CHIEF COMPLAINT:  weakness   Brief History:  35 year old with a history of remote cocaine abuse admitted with right-sided watershed infarcts, right carotid artery thrombus , status post thrombectomy by neuro IR  Apparently parents found him at 21 AM after they received a text from him, having difficulty out of bed for 2 days.  He was found on the floor, brought in by EMS.  Code stroke eventually activated.  CT angiogram showed free-floating thrombus in the right carotid artery with suspicion of possible carotid dissection.  MRI confirmed patchy infarcts in the right cerebral hemisphere as well as MCA/PCA and MCA/ACA watershed territories Underwent complete revascularization of right ICA long segment thrombus and revascularization of right MCA, brought to PACU intubated where he was subsequently extubated.  PCCM consulted for further management  He was noted to be hyponatremic, hypocalcemic and hypokalemic  1/28: He reports a few days of severe constipation prior to coming in. He had decreased PO intake of solids and fluids.   Past Medical History:  Tobacco abuse Remote history of cocaine abuse Anxiety Hypertension  Significant Hospital Events:    Consults:  Neurology Neuro IR  Procedures:  1/27 neuro IR S/P  Bilateral common carotid and Lt Vert A angiograms. RT CFA approach. S/P complete revascularization of neat occlusive RT ICA due to long seg thrombus   Significant Diagnostic Tests:  CTA head and neck 1/27 Thrombus in the right carotid bulb extending superiorly along the proximal and mid cervical segment of the right ICA resulting in severe stenosis.  Luminal irregularity at the right distal M2/M3/MCA may represent subocclusive clot  MRI 1/27 Patchy acute cortical/subcortical infarcts within the right  cerebral hemisphere within the right MCA territory as well as MCA/PCA and MCA/ACA watershed territories  [employer] Data:    Antimicrobials:     Interim History / Subjective:  No acute events overnight.   Patient feeling better today. Sitting up in chair eating lunch. He reports some left hand weakness. Otherwise denies paresthesias or vision changes. No nausea or vomiting.   Objective   Blood pressure 116/90, pulse 77, temperature 97.9 F (36.6 C), temperature source Oral, resp. rate (!) 28, height 6\' 1"  (1.854 m), weight 99.8 kg, SpO2 93 %.        Intake/Output Summary (Last 24 hours) at 12/30/2020 1054 Last data filed at 12/30/2020 0800 Gross per 24 hour  Intake 2430.87 ml  Output 650 ml  Net 1780.87 ml   Filed Weights   12/29/20 1249  Weight: 99.8 kg    Examination: General: alert, sitting up in chair, no acute distress, young male HENT: PERRL, sclera anicteric, moist mucous membranes Lungs: Clear breath sounds bilateral. No wheezing or rhonchi. 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  - Neuro IR following - Antiplatelet agents and further recommendations per their services  Hypertension - PRN labetalol ordered and has been given 2 times today - Metoprolol 25mg  BID ordered - Goal BP 120-140 sBP - continue to hold lisinopril for now  Hyponatremia  Hypocalcemia  Hypokalemia Likely in setting of poor PO intake prior to admission. Also reports heavy alcohol use. On SNRI home medication - Na is correcting nicely with normal saline resuscitation - TSH within normal limits - Will decrease Na  check frequency - also resumed oral diet today - replete as needed  Prolonged QTC - repeat EKG  Alcohol Abuse - last drink 12/22/20 - Not showing signs of withdrawal, will monitor  Depression - restart home trazodone - Continue to hold venlafaxine for now  Best practice (evaluated daily)  Diet: heart healthy Pain/Anxiety/Delirium protocol (if indicated): N/A  DVT prophylaxis: Lovenox when okay with neurology GI prophylaxis: Protonix Glucose control: N/A Mobility: PT Disposition: ICU for now, per primary team  Goals of Care:   Code Status: Full  Labs   CBC: Recent Labs  Lab 12/29/20 1222 12/29/20 1229 12/30/20 0316  WBC 7.0  --  5.8  NEUTROABS 4.8  --  3.2  HGB 15.2 16.3 12.5*  HCT 43.2 48.0 34.4*  MCV 94.1  --  94.2  PLT 133*  --  128*    Basic Metabolic Panel: Recent Labs  Lab 12/29/20 1222 12/29/20 1229 12/29/20 1317 12/29/20 2030 12/29/20 2316 12/30/20 0316  NA 128* 125*  --  130* 131* 131*  K 2.8* 3.6  --   --   --  2.7*  CL 81* 80*  --   --   --  88*  CO2 29  --   --   --   --  25  GLUCOSE 165* 163*  --   --   --  165*  BUN 28* 42*  --   --   --  21*  CREATININE 1.11 0.80  --   --   --  0.85  CALCIUM 7.9*  --   --   --   --  7.1*  MG  --   --  2.0  --   --   --    GFR: Estimated Creatinine Clearance: 149.4 mL/min (by C-G formula based on SCr of 0.85 mg/dL). Recent Labs  Lab 12/29/20 1222 12/30/20 0316  WBC 7.0 5.8    Liver Function Tests: Recent Labs  Lab 12/29/20 1222 12/30/20 0316  AST 43* 32  ALT 21 18  ALKPHOS 79 61  BILITOT 2.2* 1.5*  PROT 6.3* 5.3*  ALBUMIN 2.8* 2.3*   No results for input(s): LIPASE, AMYLASE in the last 168 hours. No results for input(s): AMMONIA in the last 168 hours.  ABG    Component Value Date/Time   TCO2 35 (H) 12/29/2020 1229     Coagulation Profile: Recent Labs  Lab 12/29/20 1222  INR 1.3*    Cardiac Enzymes: Recent Labs  Lab 12/29/20 2030  CKTOTAL 43*    HbA1C: Hgb A1c MFr  Bld  Date/Time Value Ref Range Status  12/30/2020 05:20 AM 5.5 4.8 - 5.6 % Final    Comment:    (NOTE) Pre diabetes:          5.7%-6.4%  Diabetes:              >6.4%  Glycemic control for   <7.0% adults with diabetes     CBG: No results for input(s): GLUCAP in the last 168 hours.   Freda Jackson, MD [facility] Office: [phone]  If no response to pager , please call [phone] until 7 pm After 7:00 pm call Elink  [phone]   12/30/2020

## 2020-12-30 NOTE — Progress Notes (Signed)
Lower extremity venous bilateral study and TCD bubble study completed.   Please see CV Proc for preliminary results.   Darlin Coco, RDMS

## 2020-12-30 NOTE — Evaluation (Signed)
Speech Language Pathology Evaluation Patient Details Name: Antonio Rogers MRN: [id number] DOB: November 05, 1986 Today's Date: 12/30/2020 Time: 1545-1600 SLP Time Calculation (min) (ACUTE ONLY): 15 min  Problem List:  Patient Active Problem List   Diagnosis Date Noted  . 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 Acute ischemic right internal carotid artery (ICA) stroke ([place]) 12/29/2020  . Hyperlipidemia 04/24/2016  . Essential hypertension 12/23/2015   Past Medical History:  Past Medical History:  Diagnosis Date  . Anxiety   . Chicken pox   . Depression   . Drug abuse and dependence ([place])    cocaine   . Hypertension    high blood pressure readings   Past Surgical History:  Past Surgical History:  Procedure Laterality Date  . RADIOLOGY WITH ANESTHESIA N/A 12/29/2020   Procedure: IR WITH ANESTHESIA;  Surgeon: Radiologist, Medication, MD;  Location: [facility];  Service: Radiology;  Laterality: N/A;  . TONSILLECTOMY  2009   HPI:  Pt is a 35 year old with a history of remote cocaine abuse admitted with right-sided watershed infarcts, right carotid artery thrombus , status post thrombectomy by neuro IR on 12/29/2020.   Assessment / Plan / Recommendation Clinical Impression  Pt participated in speech/language/cognition evaluation. Pt denied any baseline deficits in speech, language, or cognition. Pt stated that he was fully independent at baseline and lived alone. Per the pt, he is currently unemployed, but has a bachelor's degree in communication. The [facility] Mental Status Examination was completed to evaluate the pt's cognitive-linguistic skills. He achieved a score of 21/30 which is below the normal limits of 27 or more out of 30 and is suggestive of a mild impairment. He exhibited difficulty in the areas of awareness, attention, memory, complex problem solving, temporal orientation, and executive function. Pt's motor speech and  language skills were WNL. Skilled SLP services are clinically indicated at this time to improve cognitive-linguistic function.    SLP Assessment  SLP Recommendation/Assessment: Patient needs continued Speech Lanaguage Pathology Services SLP Visit Diagnosis: Cognitive communication deficit (R41.841)    Follow Up Recommendations  Inpatient Rehab    Frequency and Duration min 2x/week  2 weeks      SLP Evaluation Cognition  Overall Cognitive Status: Impaired/Different from baseline Arousal/Alertness: Awake/alert Orientation Level: Oriented to person;Oriented to place;Oriented to situation (Partially oriented to time; stated it is Saturday) Attention: Focused;Sustained Focused Attention: Impaired Focused Attention Impairment: Verbal complex Sustained Attention: Impaired Sustained Attention Impairment: Verbal complex Memory: Impaired Memory Impairment: Retrieval deficit (Immediate & delayed: 5/5; paragraph: 6/8) Awareness: Impaired Awareness Impairment: Emergent impairment Problem Solving: Impaired Problem Solving Impairment: Verbal complex Executive Function: Sequencing;Reasoning;Organizing Reasoning: Appears intact Sequencing: Impaired Sequencing Impairment: Verbal complex (Clock drawing: 0/4) Organizing: Impaired Organizing Impairment: Verbal complex (backward digit span: 1/2)       Comprehension  Auditory Comprehension Overall Auditory Comprehension: Appears within functional limits for tasks assessed Yes/No Questions: Within Functional Limits Commands: Within Functional Limits Conversation: Complex    Expression Expression Primary Mode of Expression: Verbal Verbal Expression Overall Verbal Expression: Appears within functional limits for tasks assessed Initiation: No impairment Repetition: No impairment Naming: No impairment Pragmatics: No impairment   Oral / Motor  Oral Motor/Sensory Function Overall Oral Motor/Sensory Function: Within functional limits Motor  Speech Overall Motor Speech: Appears within functional limits for tasks assessed Respiration: Within functional limits Phonation: Normal Resonance: Within functional limits Articulation: Within functional limitis Intelligibility: Intelligible Motor Planning: Witnin functional limits Motor Speech Errors: Not applicable   [employer]  Ronne Binning, [place], [place] Office number [phone] Pager [place] 12/30/2020, 4:06 PM

## 2020-12-30 NOTE — Progress Notes (Signed)
Inpatient Rehab Admissions Coordinator Note:   Per therapy  recommendations, pt was screened for CIR candidacy by Shann Medal, PT, DPT.  At this time we are recommending a CIR consult and I will lace an order per our protocol..  Please contact me with questions.   Shann Medal, PT, DPT [phone] 12/30/20 4:31 PM

## 2020-12-30 NOTE — Progress Notes (Signed)
  Echocardiogram 2D Echocardiogram has been performed.  Jennette Dubin 12/30/2020, 11:57 AM

## 2020-12-30 NOTE — Evaluation (Signed)
Physical Therapy Evaluation Patient Details Name: Antonio Rogers MRN: [id number] DOB: 04-15-1986 Today's Date: 12/30/2020   History of Present Illness  35 year old with a history of remote cocaine abuse admitted with right-sided watershed infarcts, right carotid artery thrombus , status post thrombectomy by neuro IR on 12/29/2020.  Clinical Impression  Pt presents to PT with deficits in L sided strength, visual-spatial awareness, balance, cognition, gait. Pt demonstrates LUE weakness more significant than LLE weakness which impairs his ability to perform mobility and ADLs. Pt also demonstrates L inattention througohut session, with a preference for R lateral cervical rotation and multiple cues needed to attend to L side and to scan to left side when mobilizing. Pt will benefit from aggressive mobilization and acute PT POC to improve mobility quality and to reduce falls risk. PT recommends CIR placement at this time due to impaired attention and due to the pt's independence prior to this CVA.    Follow Up Recommendations CIR    Equipment Recommendations  Other (comment) (shower seat)    Recommendations for Other Services Rehab consult     Precautions / Restrictions Precautions Precautions: Fall Restrictions Weight Bearing Restrictions: No      Mobility  Bed Mobility Overal bed mobility: Needs Assistance Bed Mobility: Rolling;Supine to Sit Rolling: Min guard   Supine to sit: Mod assist     General bed mobility comments: pt attempts to laterally scoot hips toward edge of bed initially, requires cues to attend to edge of bed. Then sits into long sitting and pivots hips toward edge. Pt requires physical assistance and cues to maintain safety.    Transfers Overall transfer level: Needs assistance Equipment used: Rolling walker (2 wheeled);2 person hand held assist Transfers: Sit to/from Stand Sit to Stand: +2 physical assistance;Min assist (minA with RW, requires cues for  hand placement and physical assist for L hand placement)         General transfer comment: pt requires cues to guard L wrist due to aline placement  Ambulation/Gait Ambulation/Gait assistance: Min assist Gait Distance (Feet): 70 Feet (15' with RW, 50' without device. Additional trial with bilateral hand hold) Assistive device: Rolling walker (2 wheeled);None;2 person hand held assist Gait Pattern/deviations: Step-to pattern;Decreased step length - right;Decreased step length - left Gait velocity: reduced Gait velocity interpretation: <1.8 ft/sec, indicate of risk for recurrent falls General Gait Details: pt with short step-to gait, reduced stride lengh. Pt with difficulty maintaining L hand on RW, requires PT cues to attend. Pt also with difficulty following visual cues provided by PT on left side.  Stairs            Wheelchair Mobility    Modified Rankin (Stroke Patients Only) Modified Rankin (Stroke Patients Only) Pre-Morbid Rankin Score: No symptoms Modified Rankin: Moderately severe disability     Balance Overall balance assessment: Needs assistance Sitting-balance support: No upper extremity supported;Feet supported Sitting balance-Leahy Scale: Fair Sitting balance - Comments: R gaze and trunk rotation. pt with decrease awareness to midline   Standing balance support: No upper extremity supported Standing balance-Leahy Scale: Poor Standing balance comment: minG with or without UE support                             Pertinent Vitals/Pain Pain Assessment: No/denies pain    Home Living Family/patient expects to be discharged to:: Private residence Living Arrangements: Alone Available Help at Discharge: Family;Available 24 hours/day (parents) Type of Home: House Home Access: Level entry  Home Layout: Multi-level (split level 4  steps down den, 5 step to bedroom-- 9 total from lowest level to top level) Home Equipment: None Additional Comments:  pitbull Lucy- able to let the dog out without walking    Prior Function Level of Independence: Independent         Comments: not working     Hand Dominance   Dominant Hand: Right    Extremity/Trunk Assessment   Upper Extremity Assessment Upper Extremity Assessment: Defer to OT evaluation LUE Deficits / Details: 3 out 5 MMT shoulder 90 degrees against gravity, able to extend elbow and wrist ( limited by Aline in wrist) able to extend digits but not full extension LUE Sensation: decreased light touch (feels numb) LUE Coordination: decreased fine motor;decreased gross motor    Lower Extremity Assessment Lower Extremity Assessment: LLE deficits/detail LLE Deficits / Details: grossly 4/5 LLE    Cervical / Trunk Assessment Cervical / Trunk Assessment: Normal (pt with preference for mild R lateral cervical rotation due to L neglect)  Communication   Communication: No difficulties  Cognition Arousal/Alertness: Awake/alert Behavior During Therapy: WFL for tasks assessed/performed Overall Cognitive Status: Impaired/Different from baseline Area of Impairment: Safety/judgement                         Safety/Judgement: Decreased awareness of deficits (reduced awareness of L inattention)            General Comments General comments (skin integrity, edema, etc.): VSS on RA, pt provided education on the need for increased attention to L side, positioned with TV slightly left of midline to encourage left attention    Exercises Other Exercises Other Exercises: educated on shoulder elevation and decreased and able to demonstrate Other Exercises: L hand digit extension / limited by aline to focus on wrist/ hand movement. RN to d/c aline today   Assessment/Plan    PT Assessment Patient needs continued PT services  PT Problem List Decreased strength;Decreased balance;Decreased mobility;Decreased activity tolerance;Decreased knowledge of use of DME;Decreased safety  awareness;Decreased knowledge of precautions       PT Treatment Interventions Gait training;DME instruction;Stair training;Functional mobility training;Therapeutic activities;Therapeutic exercise;Balance training;Neuromuscular re-education;Patient/family education    PT Goals (Current goals can be found in the Care Plan section)  Acute Rehab PT Goals Patient Stated Goal: to return to independent mobility PT Goal Formulation: With patient Time For Goal Achievement: 01/13/21 Potential to Achieve Goals: Good Additional Goals Additional Goal #1: Pt will be able to verbalize his L neglect and attend to L side consistently when mobilizing, independently    Frequency Min 4X/week   Barriers to discharge        Co-evaluation PT/OT/SLP Co-Evaluation/Treatment: Yes Reason for Co-Treatment: Complexity of the patient's impairments (multi-system involvement);To address functional/ADL transfers PT goals addressed during session: Mobility/safety with mobility;Balance;Proper use of DME;Strengthening/ROM OT goals addressed during session: ADL's and self-care;Proper use of Adaptive equipment and DME;Strengthening/ROM       AM-PAC PT "6 Clicks" Mobility  Outcome Measure Help needed turning from your back to your side while in a flat bed without using bedrails?: A Little Help needed moving from lying on your back to sitting on the side of a flat bed without using bedrails?: A Lot Help needed moving to and from a bed to a chair (including a wheelchair)?: A Little Help needed standing up from a chair using your arms (e.g., wheelchair or bedside chair)?: A Little Help needed to walk in hospital room?: A Little Help  needed climbing 3-5 steps with a railing? : A Lot 6 Click Score: 16    End of Session Equipment Utilized During Treatment: Gait belt Activity Tolerance: Patient tolerated treatment well Patient left: in chair;with call bell/phone within reach;with chair alarm set Nurse Communication:  Mobility status PT Visit Diagnosis: Other abnormalities of gait and mobility (R26.89);Other symptoms and signs involving the nervous system (R29.898);Hemiplegia and hemiparesis Hemiplegia - Right/Left: Left Hemiplegia - caused by: Cerebral infarction    Time: 0920-0956 PT Time Calculation (min) (ACUTE ONLY): 36 min   Charges:   PT Evaluation $PT Eval Moderate Complexity: 1 Mod          Zenaida Niece, PT, DPT Acute Rehabilitation Pager: [phone]   Zenaida Niece 12/30/2020, 10:52 AM

## 2020-12-30 NOTE — Discharge Instructions (Addendum)
Avoid strenuous activity, lifting more than 10 lbs until further notice.   Please set up an appointment with the primary care doctor within two weeks. If you don have a primary doctor pleas set up an appointment with a doctor of your choice as soon as possible. You need a primary care doctor to manage your health.    Femoral Site Care This sheet gives you information about how to care for yourself after your procedure. Your health care provider may also give you more specific instructions. If you have problems or questions, contact your health care provider. What can I expect after the procedure? After the procedure, it is common to have:  Bruising that usually fades within 1-2 weeks.  Tenderness at the site. Follow these instructions at home: Wound care 1. Follow instructions from your health care provider about how to take care of your insertion site. Make sure you: ? Wash your hands with soap and water before you change your bandage (dressing). If soap and water are not available, use hand sanitizer. ? Change your dressing as directed- pressure dressing removed 24 hours post-procedure (and switch for bandaid), bandaid removed 72 hours post-procedure 2. Do not take baths, swim, or use a hot tub for 7 days post-procedure. 3. You may shower 48 hours after the procedure or as told by your health care provider. ? Gently wash the site with plain soap and water. ? Pat the area dry with a clean towel. ? Do not rub the site. This may cause bleeding. 4. Check your site every day for signs of infection. Check for: ? Redness, swelling, or pain. ? Fluid or blood. ? Warmth. ? Pus or a bad smell. Activity  Do not stoop, bend, or lift anything that is heavier than 10 lb (4.5 kg) for 2 weeks post-procedure.  Do not drive self for 2 weeks post-procedure. Contact a health care provider if you have:  A fever or chills.  You have redness, swelling, or pain around your insertion site. Get help  right away if:  The catheter insertion area swells very fast.  You pass out.  You suddenly start to sweat or your skin gets clammy.  The catheter insertion area is bleeding, and the bleeding does not stop when you hold steady pressure on the area.  The area near or just beyond the catheter insertion site becomes pale, cool, tingly, or numb. These symptoms may represent a serious problem that is an emergency. Do not wait to see if the symptoms will go away. Get medical help right away. Call your local emergency services (911 in the [place] Do not drive yourself to the hospital.  This information is not intended to replace advice given to you by your health care provider. Make sure you discuss any questions you have with your health care provider. Document Revised: 12/02/2017 Document Reviewed: 12/02/2017 Elsevier Patient Education  2020 [employer].

## 2020-12-30 NOTE — Evaluation (Signed)
Occupational Therapy Evaluation Patient Details Name: Antonio Rogers MRN: [id number] DOB: [birth year] Today's Date: 12/30/2020    History of Present Illness 35 year old with a history of remote cocaine abuse admitted with right-sided watershed infarcts, right carotid artery thrombus , status post thrombectomy by neuro IR on 12/29/2020.   Clinical Impression   PT admitted with R watershed with revascularization. Pt currently with functional limitiations due to the deficits listed below (see OT problem list). Pt currently with L inattention and L UE deficits. Pt requires extensive time and effort to don socks due to L UE deficits.  Pt will benefit from skilled OT to increase their independence and safety with adls and balance to allow discharge CIR will progress quickly and to mod I level .     Follow Up Recommendations  CIR    Equipment Recommendations  3 in 1 bedside commode;Other (comment) (RW)    Recommendations for Other Services Rehab consult     Precautions / Restrictions Precautions Precautions: Fall      Mobility Bed Mobility Overal bed mobility: Needs Assistance Bed Mobility: Rolling;Supine to Sit Rolling: Min guard   Supine to sit: Mod assist     General bed mobility comments: pt able to initiate with mod cues and needs cues for eob and guarding pt at EOB. pt needed to long sit and then turn to have awareness to L side of bed.pt requires cues to guard L wrist due to aline placement    Transfers Overall transfer level: Needs assistance Equipment used: 2 person hand held assist Transfers: Sit to/from Stand Sit to Stand: +2 physical assistance;Min assist         General transfer comment: pt requires cues to guard L wrist due to aline placement    Balance Overall balance assessment: Mild deficits observed, not formally tested;Needs assistance Sitting-balance support: No upper extremity supported;Feet supported Sitting balance-Leahy Scale: Fair Sitting  balance - Comments: R gaze and trunk rotation. pt with decrease awareness to midline   Standing balance support: Bilateral upper extremity supported;During functional activity Standing balance-Leahy Scale: Fair                             ADL either performed or assessed with clinical judgement   ADL Overall ADL's : Needs assistance/impaired Eating/Feeding: Sitting;Moderate assistance   Grooming: Wash/dry hands;Wash/dry face;Moderate assistance;Sitting               Lower Body Dressing: Moderate assistance;Sit to/from stand Lower Body Dressing Details (indicate cue type and reason): unable to use L hand to hold sock. pt able to figure 4 cross and with Min ()A for balance at EOB and thread R over toes to have pt pull up. pt able to hook and don L with sitting in bed criss cross apple sauce Toilet Transfer: +2 for physical assistance;Minimal assistance           Functional mobility during ADLs: +2 for physical assistance;Minimal assistance General ADL Comments: pt needs cues for L visual field. pt rolling nearly to off eob and lack awareness     Vision Baseline Vision/History: No visual deficits Vision Assessment?: Yes Eye Alignment: Impaired (comment) Alignment/Gaze Preference: Gaze right Tracking/Visual Pursuits: Impaired - to be further tested in functional context;Unable to hold eye position out of midline;Requires cues, head turns, or add eye shifts to track Convergence: Impaired (comment) Additional Comments: pt noted to have dysconjugate gaze but denies diplopia     Perception Perception Perception  Tested?: Yes Perception Deficits: Inattention/neglect Inattention/Neglect: Does not attend to left side of body   Praxis      Pertinent Vitals/Pain Pain Assessment: No/denies pain     Hand Dominance Right   Extremity/Trunk Assessment Upper Extremity Assessment Upper Extremity Assessment: LUE deficits/detail LUE Deficits / Details: 3 out 5 MMT  shoulder 90 degrees against gravity, able to extend elbow and wrist ( limited by Aline in wrist) able to extend digits but not full extension LUE Sensation: decreased light touch (feels numb) LUE Coordination: decreased fine motor;decreased gross motor   Lower Extremity Assessment Lower Extremity Assessment: Defer to PT evaluation   Cervical / Trunk Assessment Cervical / Trunk Assessment: Normal   Communication Communication Communication: No difficulties   Cognition Arousal/Alertness: Awake/alert Behavior During Therapy: WFL for tasks assessed/performed Overall Cognitive Status: Within Functional Limits for tasks assessed                                     General Comments       Exercises Exercises: Other exercises Other Exercises Other Exercises: educated on shoulder elevation and decreased and able to demonstrate Other Exercises: L hand digit extension / limited by aline to focus on wrist/ hand movement. RN to d/c aline today   Shoulder Instructions      Home Living Family/patient expects to be discharged to:: Private residence Living Arrangements: Alone Available Help at Discharge: Family;Available 24 hours/day (call parents) Type of Home: House Home Access: Level entry     Home Layout: Multi-level (split level 4  steps down den, 5 step to bedroom-- 9 total from lowest level to top level)   Alternate Level Stairs-Rails: Right;Left (opposite sides for the different stairs) Bathroom Shower/Tub: Occupational psychologist: Standard     Home Equipment: None   Additional Comments: pitbull Lucy- able to let the dog out without walking      Prior Functioning/Environment Level of Independence: Independent        Comments: not working        OT Problem List: Decreased strength;Decreased activity tolerance;Impaired balance (sitting and/or standing);Decreased safety awareness;Decreased cognition;Decreased coordination;Impaired  vision/perception;Decreased range of motion;Decreased knowledge of use of DME or AE;Decreased knowledge of precautions;Impaired sensation;Impaired UE functional use      OT Treatment/Interventions: Self-care/ADL training;Therapeutic exercise;Neuromuscular education;Energy conservation;DME and/or AE instruction;Manual therapy;Modalities;Splinting;Therapeutic activities;Cognitive remediation/compensation;Visual/perceptual remediation/compensation;Patient/family education;Balance training    OT Goals(Current goals can be found in the care plan section) Acute Rehab OT Goals Patient Stated Goal: to be able to return to do lucy OT Goal Formulation: With patient Time For Goal Achievement: 01/13/21 Potential to Achieve Goals: Good  OT Frequency: Min 2X/week   Barriers to D/C:            Co-evaluation PT/OT/SLP Co-Evaluation/Treatment: Yes Reason for Co-Treatment: To address functional/ADL transfers;For patient/therapist safety;Necessary to address cognition/behavior during functional activity   OT goals addressed during session: ADL's and self-care;Proper use of Adaptive equipment and DME;Strengthening/ROM      AM-PAC OT "6 Clicks" Daily Activity     Outcome Measure Help from another person eating meals?: A Little Help from another person taking care of personal grooming?: A Little Help from another person toileting, which includes using toliet, bedpan, or urinal?: A Little Help from another person bathing (including washing, rinsing, drying)?: A Little Help from another person to put on and taking off regular upper body clothing?: A Little Help from another person  to put on and taking off regular lower body clothing?: A Lot 6 Click Score: 17   End of Session Equipment Utilized During Treatment: Gait belt Nurse Communication: Mobility status;Precautions  Activity Tolerance: Patient tolerated treatment well Patient left: in chair;Other (comment) (PT to progress gait now that pt is  finding midline)  OT Visit Diagnosis: Unsteadiness on feet (R26.81);Muscle weakness (generalized) (M62.81)                Time: [id number] OT Time Calculation (min): 22 min Charges:  OT General Charges $OT Visit: 1 Visit OT Evaluation $OT Eval Moderate Complexity: 1 Mod   Brynn, OTR/L  Acute Rehabilitation Services Pager: [phone] Office: [phone] .   Jeri Modena 12/30/2020, 9:55 AM

## 2020-12-31 ENCOUNTER — Encounter (HOSPITAL_COMMUNITY): Payer: Self-pay | Admitting: Neurology

## 2020-12-31 DIAGNOSIS — I63231 Cerebral infarction due to unspecified occlusion or stenosis of right carotid arteries: Secondary | ICD-10-CM | POA: Diagnosis not present

## 2020-12-31 LAB — MAGNESIUM: Magnesium: 1.9 mg/dL (ref 1.7–2.4)

## 2020-12-31 LAB — CBC
HCT: 33.7 % — ABNORMAL LOW (ref 39.0–52.0)
Hemoglobin: 12 g/dL — ABNORMAL LOW (ref 13.0–17.0)
MCH: 33.7 pg (ref 26.0–34.0)
MCHC: 35.6 g/dL (ref 30.0–36.0)
MCV: 94.7 fL (ref 80.0–100.0)
Platelets: 163 10*3/uL (ref 150–400)
RBC: 3.56 MIL/uL — ABNORMAL LOW (ref 4.22–5.81)
RDW: 17.8 % — ABNORMAL HIGH (ref 11.5–15.5)
WBC: 7.4 10*3/uL (ref 4.0–10.5)
nRBC: 0 % (ref 0.0–0.2)

## 2020-12-31 LAB — RAPID URINE DRUG SCREEN, HOSP PERFORMED
Amphetamines: NOT DETECTED
Barbiturates: NOT DETECTED
Benzodiazepines: NOT DETECTED
Cocaine: NOT DETECTED
Opiates: NOT DETECTED
Tetrahydrocannabinol: NOT DETECTED

## 2020-12-31 LAB — COMPREHENSIVE METABOLIC PANEL
ALT: 18 U/L (ref 0–44)
AST: 49 U/L — ABNORMAL HIGH (ref 15–41)
Albumin: 2.4 g/dL — ABNORMAL LOW (ref 3.5–5.0)
Alkaline Phosphatase: 70 U/L (ref 38–126)
Anion gap: 13 (ref 5–15)
BUN: 15 mg/dL (ref 6–20)
CO2: 26 mmol/L (ref 22–32)
Calcium: 7.5 mg/dL — ABNORMAL LOW (ref 8.9–10.3)
Chloride: 93 mmol/L — ABNORMAL LOW (ref 98–111)
Creatinine, Ser: 0.75 mg/dL (ref 0.61–1.24)
GFR, Estimated: >60 mL/min (ref >60)
Glucose, Bld: 160 mg/dL — ABNORMAL HIGH (ref 70–99)
Potassium: 2.5 mmol/L — CL (ref 3.5–5.1)
Sodium: 132 mmol/L — ABNORMAL LOW (ref 135–145)
Total Bilirubin: 0.9 mg/dL (ref 0.3–1.2)
Total Protein: 5.2 g/dL — ABNORMAL LOW (ref 6.5–8.1)

## 2020-12-31 LAB — PROTEIN C, TOTAL: Protein C, Total: 64 % (ref 60–150)

## 2020-12-31 LAB — PROTEIN C ACTIVITY: Protein C Activity: 75 % (ref 73–180)

## 2020-12-31 LAB — CORTISOL-AM, BLOOD: Cortisol - AM: 20.6 ug/dL (ref 6.7–22.6)

## 2020-12-31 LAB — LUPUS ANTICOAGULANT PANEL
DRVVT: 59.8 s — ABNORMAL HIGH (ref 0.0–47.0)
PTT Lupus Anticoagulant: 44.1 s (ref 0.0–51.9)

## 2020-12-31 LAB — URINALYSIS, ROUTINE W REFLEX MICROSCOPIC
Bilirubin Urine: NEGATIVE
Glucose, UA: NEGATIVE mg/dL
Hgb urine dipstick: NEGATIVE
Ketones, ur: 5 mg/dL — AB
Leukocytes,Ua: NEGATIVE
Nitrite: NEGATIVE
Protein, ur: NEGATIVE mg/dL
Specific Gravity, Urine: 1.024 (ref 1.005–1.030)
pH: 7 (ref 5.0–8.0)

## 2020-12-31 LAB — VITAMIN B12: Vitamin B-12: 248 pg/mL (ref 180–914)

## 2020-12-31 LAB — OSMOLALITY, URINE: Osmolality, Ur: 713 mOsm/kg (ref 300–900)

## 2020-12-31 LAB — PROTEIN S ACTIVITY: Protein S Activity: 189 % — ABNORMAL HIGH (ref 63–140)

## 2020-12-31 LAB — SODIUM, URINE, RANDOM: Sodium, Ur: 46 mmol/L

## 2020-12-31 LAB — DRVVT MIX: dRVVT Mix: 46.2 s — ABNORMAL HIGH (ref 0.0–40.4)

## 2020-12-31 LAB — POTASSIUM: Potassium: 3.7 mmol/L (ref 3.5–5.1)

## 2020-12-31 LAB — DRVVT CONFIRM: dRVVT Confirm: 1.2 ratio (ref 0.8–1.2)

## 2020-12-31 LAB — PROTEIN S, TOTAL: Protein S Ag, Total: 153 % — ABNORMAL HIGH (ref 60–150)

## 2020-12-31 MED ORDER — POTASSIUM CHLORIDE CRYS ER 20 MEQ PO TBCR
40.0000 meq | EXTENDED_RELEASE_TABLET | ORAL | Status: AC
  Administered 2020-12-31 (×2): 40 meq via ORAL
  Filled 2020-12-31 (×2): qty 2

## 2020-12-31 MED ORDER — POTASSIUM CHLORIDE CRYS ER 20 MEQ PO TBCR
40.0000 meq | EXTENDED_RELEASE_TABLET | Freq: Once | ORAL | Status: AC
  Administered 2020-12-31: 40 meq via ORAL
  Filled 2020-12-31: qty 2

## 2020-12-31 MED ORDER — POTASSIUM CHLORIDE 10 MEQ/100ML IV SOLN
10.0000 meq | INTRAVENOUS | Status: AC
  Administered 2020-12-31 (×4): 10 meq via INTRAVENOUS
  Filled 2020-12-31 (×4): qty 100

## 2020-12-31 MED ORDER — POTASSIUM CHLORIDE CRYS ER 20 MEQ PO TBCR: 40.0000 meq | EXTENDED_RELEASE_TABLET | Freq: Once | ORAL | Status: DC

## 2020-12-31 MED ORDER — CALCIUM GLUCONATE-NACL 1-0.675 GM/50ML-% IV SOLN
1.0000 g | Freq: Once | INTRAVENOUS | Status: AC
  Administered 2020-12-31: 1000 mg via INTRAVENOUS
  Filled 2020-12-31: qty 50

## 2020-12-31 MED ORDER — FOLIC ACID 1 MG PO TABS
2.0000 mg | ORAL_TABLET | Freq: Every day | ORAL | Status: DC
  Administered 2020-12-31 – 2021-01-03 (×4): 2 mg via ORAL
  Filled 2020-12-31 (×4): qty 2

## 2020-12-31 MED ORDER — CYANOCOBALAMIN 1000 MCG/ML IJ SOLN
1000.0000 ug | INTRAMUSCULAR | Status: AC
  Administered 2020-12-31: 1000 ug via INTRAMUSCULAR
  Filled 2020-12-31: qty 1

## 2020-12-31 MED ORDER — MAGNESIUM SULFATE 2 GM/50ML IV SOLN
2.0000 g | Freq: Once | INTRAVENOUS | Status: AC
  Administered 2020-12-31: 2 g via INTRAVENOUS
  Filled 2020-12-31: qty 50

## 2020-12-31 NOTE — Progress Notes (Signed)
[facility] Progress Note Patient Name: Antonio Rogers DOB: [birth year] MRN: [id number]   Date of Service  12/31/2020  HPI/Events of Note  K+ 2.5, creatinine 0.75  eICU Interventions  Adult electrolyte replacement protocol for K+ ordered.        Frederik Pear 12/31/2020, 6:46 AM

## 2020-12-31 NOTE — Progress Notes (Signed)
STROKE TEAM PROGRESS NOTE   SUBJECTIVE (INTERVAL HISTORY) The patient is status post thrombectomy.  He is doing about the same.  No clear worsening or improvement of his symptoms.   OBJECTIVE Temp:  [98 F (36.7 C)-98.7 F (37.1 C)] 98.7 F (37.1 C) (01/29 0400) Pulse Rate:  [71-94] 89 (01/29 0800) Cardiac Rhythm: Normal sinus rhythm (01/28 2000) Resp:  [10-28] 19 (01/29 0800) BP: (120-148)/(71-98) 122/78 (01/29 0700) SpO2:  [93 %-99 %] 96 % (01/29 0800)  No results for input(s): GLUCAP in the last 168 hours. Recent Labs  Lab 12/29/20 1222 12/29/20 1229 12/29/20 1317 12/29/20 2030 12/29/20 2316 12/30/20 0316 12/30/20 0821 12/31/20 0353  NA 128* 125*  --  130* 131* 131* 131* 132*  K 2.8* 3.6  --   --   --  2.7*  --  2.5*  CL 81* 80*  --   --   --  88*  --  93*  CO2 29  --   --   --   --  25  --  26  GLUCOSE 165* 163*  --   --   --  165*  --  160*  BUN 28* 42*  --   --   --  21*  --  15  CREATININE 1.11 0.80  --   --   --  0.85  --  0.75  CALCIUM 7.9*  --   --   --   --  7.1*  --  7.5*  MG  --   --  2.0  --   --   --   --  1.9   Recent Labs  Lab 12/29/20 1222 12/30/20 0316 12/31/20 0353  AST 43* 32 49*  ALT 21 18 18   ALKPHOS 79 61 70  BILITOT 2.2* 1.5* 0.9  PROT 6.3* 5.3* 5.2*  ALBUMIN 2.8* 2.3* 2.4*   Recent Labs  Lab 12/29/20 1222 12/29/20 1229 12/30/20 0316 12/31/20 0353  WBC 7.0  --  5.8 7.4  NEUTROABS 4.8  --  3.2  --   HGB 15.2 16.3 12.5* 12.0*  HCT 43.2 48.0 34.4* 33.7*  MCV 94.1  --  94.2 94.7  PLT 133*  --  128* 163   Recent Labs  Lab 12/29/20 2030  CKTOTAL 43*   Recent Labs    12/29/20 1222  LABPROT 15.8*  INR 1.3*   Recent Labs    12/31/20 0132  COLORURINE AMBER*  LABSPEC 1.024  PHURINE 7.0  GLUCOSEU NEGATIVE  HGBUR NEGATIVE  BILIRUBINUR NEGATIVE  KETONESUR 5*  PROTEINUR NEGATIVE  NITRITE NEGATIVE  LEUKOCYTESUR NEGATIVE       Component Value Date/Time   CHOL 93 12/30/2020 0316   TRIG 151 (H) 12/30/2020 0316   HDL 12  (L) 12/30/2020 0316   CHOLHDL 7.8 12/30/2020 0316   VLDL 30 12/30/2020 0316   LDLCALC 51 12/30/2020 0316   Lab Results  Component Value Date   HGBA1C 5.5 12/30/2020      Component Value Date/Time   LABOPIA NONE DETECTED 12/31/2020 0132   COCAINSCRNUR NONE DETECTED 12/31/2020 0132   LABBENZ NONE DETECTED 12/31/2020 0132   AMPHETMU NONE DETECTED 12/31/2020 0132   THCU NONE DETECTED 12/31/2020 0132   LABBARB NONE DETECTED 12/31/2020 0132    Recent Labs  Lab 12/29/20 1222  ETH <10    I have personally reviewed the radiological images below and agree with the radiology interpretations.  CT Code Stroke CTA Head W/WO contrast  Result Date: 12/29/2020 CLINICAL DATA:  Left-sided weakness.  Stroke suspected. EXAM: CT ANGIOGRAPHY HEAD AND NECK TECHNIQUE: Multidetector CT imaging of the head and neck was performed using the standard protocol during bolus administration of intravenous contrast. Multiplanar CT image reconstructions and MIPs were obtained to evaluate the vascular anatomy. Carotid stenosis measurements (when applicable) are obtained utilizing NASCET criteria, using the distal internal carotid diameter as the denominator. CONTRAST:  68mL OMNIPAQUE IOHEXOL 350 MG/ML SOLN COMPARISON:  Head CT December 29, 2020. FINDINGS: CTA NECK FINDINGS Aortic arch: Standard branching. Imaged portion shows no evidence of aneurysm or dissection. No significant stenosis of the major arch vessel origins. Right carotid system: A mural thrombus is seen in the right carotid bulb extending superiorly along the proximal and mid cervical segment of the right ICA resulting in severe stenosis. Left carotid system: Mild atherosclerotic changes. No evidence of dissection, stenosis (50% or greater) or occlusion. Vertebral arteries: Codominant. No evidence of dissection, stenosis (50% or greater) or occlusion. Skeleton: No acute findings. Other neck: Heterogeneous thyroid gland. Upper chest: Negative Review of the MIP  images confirms the above findings CTA HEAD FINDINGS Anterior circulation: Intracranial right ICA is patent. Luminal irregularity at the right distal M2/M3/MCA may represent subocclusive clot. The bilateral ACA and the left MCA vascular trees are patent. Posterior circulation: No significant stenosis, proximal occlusion, aneurysm, or vascular malformation. Venous sinuses: As permitted by contrast timing, patent. Review of the MIP images confirms the above findings IMPRESSION: 1. Thrombus in the right carotid bulb extending superiorly along the proximal and mid cervical segment of the right ICA resulting in severe stenosis. 2. Luminal irregularity at the right distal M2/M3/MCA may represent subocclusive clot. These results were called by telephone at the time of interpretation on 12/29/2020 at 1:10 pm to provider [facility] , who verbally acknowledged these results. Electronically Signed   By: Pedro Earls M.D.   On: 12/29/2020 13:19   CT HEAD WO CONTRAST  Result Date: 12/29/2020 CLINICAL DATA:  Stroke. Neuro deficit, acute, stroke suspected. Post IR thrombectomy. EXAM: CT HEAD WITHOUT CONTRAST TECHNIQUE: Contiguous axial images were obtained from the base of the skull through the vertex without intravenous contrast. COMPARISON:  Brain MRI 12/29/2020, CT angiogram head/neck 12/29/2020, noncontrast head CT 12/29/2020. FINDINGS: Brain: Redemonstrated patchy acute cortical and subcortical infarcts within the right frontal, parietal, occipital and temporal lobes within the right MCA territory and right MCA/ACA and MCA/PCA watershed territories. These infarcts do not appear significantly changed in extent as compared to the brain MRI performed earlier today and are again overall moderate in volume. Unchanged subtle petechial hemorrhage at site of an acute infarct within the right frontal lobe (series 3, image 20) and right occipital lobe (series 3, image 15). No frank hemorrhagic conversion. No  significant mass effect. Background mild ill-defined hypoattenuation within the cerebral white matter is nonspecific, but compatible with chronic small vessel ischemic disease. No extra-axial fluid collection. No evidence of intracranial mass. No midline shift. Vascular: Residual intravascular contrast limits evaluation for hyperdense vessels. Skull: Normal. Negative for fracture or focal lesion. Sinuses/Orbits: Visualized orbits show no acute finding. Mild bilateral ethmoid sinus mucosal thickening. IMPRESSION: Patchy acute cortical and subcortical infarcts, within the right cerebral hemisphere in the right MCA territory and right MCA/ACA and MCA/PCA watershed territories, not significantly changed in extent from the brain MRI performed earlier today. Unchanged subtle petechial hemorrhage at sites of acute infarct within the right frontal and occipital lobes. No significant mass effect. Background mild chronic small vessel ischemic disease. Mild ethmoid  sinus mucosal thickening. Electronically Signed   By: Kellie Simmering DO   On: 12/29/2020 16:32   CT Code Stroke CTA Neck W/WO contrast  Result Date: 12/29/2020 CLINICAL DATA:  Left-sided weakness.  Stroke suspected. EXAM: CT ANGIOGRAPHY HEAD AND NECK TECHNIQUE: Multidetector CT imaging of the head and neck was performed using the standard protocol during bolus administration of intravenous contrast. Multiplanar CT image reconstructions and MIPs were obtained to evaluate the vascular anatomy. Carotid stenosis measurements (when applicable) are obtained utilizing NASCET criteria, using the distal internal carotid diameter as the denominator. CONTRAST:  77mL OMNIPAQUE IOHEXOL 350 MG/ML SOLN COMPARISON:  Head CT December 29, 2020. FINDINGS: CTA NECK FINDINGS Aortic arch: Standard branching. Imaged portion shows no evidence of aneurysm or dissection. No significant stenosis of the major arch vessel origins. Right carotid system: A mural thrombus is seen in the right  carotid bulb extending superiorly along the proximal and mid cervical segment of the right ICA resulting in severe stenosis. Left carotid system: Mild atherosclerotic changes. No evidence of dissection, stenosis (50% or greater) or occlusion. Vertebral arteries: Codominant. No evidence of dissection, stenosis (50% or greater) or occlusion. Skeleton: No acute findings. Other neck: Heterogeneous thyroid gland. Upper chest: Negative Review of the MIP images confirms the above findings CTA HEAD FINDINGS Anterior circulation: Intracranial right ICA is patent. Luminal irregularity at the right distal M2/M3/MCA may represent subocclusive clot. The bilateral ACA and the left MCA vascular trees are patent. Posterior circulation: No significant stenosis, proximal occlusion, aneurysm, or vascular malformation. Venous sinuses: As permitted by contrast timing, patent. Review of the MIP images confirms the above findings IMPRESSION: 1. Thrombus in the right carotid bulb extending superiorly along the proximal and mid cervical segment of the right ICA resulting in severe stenosis. 2. Luminal irregularity at the right distal M2/M3/MCA may represent subocclusive clot. These results were called by telephone at the time of interpretation on 12/29/2020 at 1:10 pm to provider [facility] , who verbally acknowledged these results. Electronically Signed   By: Pedro Earls M.D.   On: 12/29/2020 13:19   MR BRAIN WO CONTRAST  Result Date: 12/29/2020 CLINICAL DATA:  Neuro deficit, acute, stroke suspected. EXAM: MRI HEAD WITHOUT CONTRAST TECHNIQUE: Multiplanar, multiecho pulse sequences of the brain and surrounding structures were obtained without intravenous contrast. COMPARISON:  Noncontrast head CT and CT angiogram head/neck performed earlier today 12/29/2020. FINDINGS: Brain: Cerebral volume is normal for age. There are patchy cortical/subcortical infarcts within the right cerebral hemisphere within the right  frontal, parietal, occipital and temporal lobes as well as right insula. These acute infarcts are present within the right MCA territory as well as MCA/PCA and MCA/ACA watershed territories. These infarcts overall moderate in volume. No significant mass effect at this time. There is mild SWI signal loss and T1 hyperintensity associated with acute infarcts within the right frontal and occipital lobes consistent with petechial hemorrhage. Background mild multifocal T2/FLAIR hyperintensity within the cerebral white matter is nonspecific, but compatible with chronic small vessel ischemic disease. No evidence of intracranial mass. No extra-axial fluid collection. No midline shift. Vascular: Expected proximal arterial flow voids. Skull and upper cervical spine: No focal marrow lesion. Sinuses/Orbits: Visualized orbits show no acute finding. No significant paranasal sinus disease at the imaged levels. IMPRESSION: Patchy acute cortical/subcortical infarcts within the right cerebral hemisphere within the right MCA territory as well as MCA/PCA and MCA/ACA watershed territories. The infarcts are overall moderate in volume. No significant mass effect at this time. Petechial hemorrhage  associated with an acute infarcts in the right frontal and right occipital lobes. Background mild chronic small vessel ischemic disease. Electronically Signed   By: Jackey LogeKyle  Golden DO   On: 12/29/2020 14:20   VAS US TRANSCRANIAL DOPPLER W BUBBLES  Result Date: 12/30/2020  Transcranial Doppler with Bubble Indications: Stroke. Comparison Study: No prior studies. Performing Technologist: Jean Rosenthalachel Hodge RDMS  Examination Guidelines: A complete evaluation includes B-mode imaging, spectral Doppler, color Doppler, and power Doppler as needed of all accessible portions of each vessel. Bilateral testing is considered an integral part of a complete examination. Limited examinations for reoccurring indications may be performed as noted.  Summary: No HITS at  rest or during Valsalva. Negative transcranial Doppler Bubble study with no evidence of right to left intracardiac communication.  A vascular evaluation was performed. The right middle cerebral artery was studied. An IV was inserted into the patient's left forearm. Verbal informed consent was obtained.  *See table(s) above for TCD measurements and observations.    Preliminary    ECHOCARDIOGRAM COMPLETE  Result Date: 12/30/2020    ECHOCARDIOGRAM REPORT   Patient Name:   Antonio Rogers Date of Exam: 12/30/2020 Medical Rec #:  [id number]             Height:       73.0 in Accession #:    [id number]            Weight:       220.0 lb Date of Birth:  09/28/1986             BSA:          2.241 m Patient Age:    34 years              BP:           120/76 mmHg Patient Gender: M                     HR:           74 bpm. Exam Location:  Inpatient Procedure: 2D Echo Indications:    Stroke I163.9  History:        Patient has no prior history of Echocardiogram examinations.                 Risk Factors:Hypertension.  Sonographer:    Thurman Coyerasey Kirkpatrick RDCS (AE) Referring Phys: [id number] SRISHTI L BHAGAT IMPRESSIONS  1. Left ventricular ejection fraction, by estimation, is 55 to 60%. The left ventricle has normal function. The left ventricle has no regional wall motion abnormalities. There is mild left ventricular hypertrophy. Left ventricular diastolic parameters are consistent with Grade I diastolic dysfunction (impaired relaxation).  2. Right ventricular systolic function is normal. The right ventricular size is normal.  3. The mitral valve is grossly normal. No evidence of mitral valve regurgitation.  4. The aortic valve is tricuspid. Aortic valve regurgitation is not visualized.  5. The inferior vena cava is normal in size with <50% respiratory variability, suggesting right atrial pressure of 8 mmHg. Comparison(s): No prior Echocardiogram. FINDINGS  Left Ventricle: Left ventricular ejection fraction, by estimation, is 55  to 60%. The left ventricle has normal function. The left ventricle has no regional wall motion abnormalities. The left ventricular internal cavity size was normal in size. There is  mild left ventricular hypertrophy. Left ventricular diastolic parameters are consistent with Grade I diastolic dysfunction (impaired relaxation). Indeterminate filling pressures. Right Ventricle: The right ventricular size is normal. No increase in  right ventricular wall thickness. Right ventricular systolic function is normal. Left Atrium: Left atrial size was normal in size. Right Atrium: Right atrial size was normal in size. Pericardium: There is no evidence of pericardial effusion. Mitral Valve: The mitral valve is grossly normal. No evidence of mitral valve regurgitation. Tricuspid Valve: The tricuspid valve is grossly normal. Tricuspid valve regurgitation is trivial. Aortic Valve: The aortic valve is tricuspid. Aortic valve regurgitation is not visualized. Pulmonic Valve: The pulmonic valve was not well visualized. Pulmonic valve regurgitation is not visualized. Aorta: The aortic root and ascending aorta are structurally normal, with no evidence of dilitation. Venous: The inferior vena cava is normal in size with less than 50% respiratory variability, suggesting right atrial pressure of 8 mmHg. IAS/Shunts: The interatrial septum was not well visualized.  LEFT VENTRICLE PLAX 2D LVIDd:         4.00 cm  Diastology LVIDs:         3.05 cm  LV e' medial:    6.22 cm/s LV PW:         1.10 cm  LV E/e' medial:  12.5 LV IVS:        1.20 cm  LV e' lateral:   5.61 cm/s LVOT diam:     2.40 cm  LV E/e' lateral: 13.9 LV SV:         77 LV SV Index:   35 LVOT Area:     4.52 cm  RIGHT VENTRICLE TAPSE (M-mode): 1.2 cm LEFT ATRIUM             Index       RIGHT ATRIUM           Index LA diam:        3.50 cm 1.56 cm/m  RA Area:     13.00 cm LA Vol (A2C):   42.2 ml 18.83 ml/m RA Volume:   23.90 ml  10.67 ml/m LA Vol (A4C):   45.2 ml 20.17 ml/m LA  Biplane Vol: 45.9 ml 20.49 ml/m  AORTIC VALVE LVOT Vmax:   88.80 cm/s LVOT Vmean:  66.100 cm/s LVOT VTI:    0.171 m  AORTA Ao Root diam: 3.10 cm MITRAL VALVE MV Area (PHT): 2.95 cm    SHUNTS MV Decel Time: 257 msec    Systemic VTI:  0.17 m MV E velocity: 78.00 cm/s  Systemic Diam: 2.40 cm MV A velocity: 54.00 cm/s MV E/A ratio:  1.44 Lyman Bishop MD Electronically signed by Lyman Bishop MD Signature Date/Time: 12/30/2020/1:43:33 PM    Final    CT HEAD CODE STROKE WO CONTRAST  Result Date: 12/29/2020 CLINICAL DATA:  Code stroke.  Left-sided weakness. EXAM: CT HEAD WITHOUT CONTRAST TECHNIQUE: Contiguous axial images were obtained from the base of the skull through the vertex without intravenous contrast. COMPARISON:  None. FINDINGS: Brain: No abnormality affects the brainstem or cerebellum. The left cerebral hemisphere is normal except for a questionable old white matter infarction in the left parietal region. On the right, there is evidence of acute infarction within the middle cerebral artery territory. Cortical and subcortical low-density seen at the margins of the MCA distribution, with diminished gray-white differentiation elsewhere within the MCA territory. Minimal petechial blood products in the area the right frontal portion of the infarction. No mass effect. No hydrocephalus. Vascular: No hyperdense vessel. Skull: Normal Sinuses/Orbits: Clear/normal Other: None ASPECTS ([place] Stroke Program Early CT Score) - Ganglionic level infarction (caudate, lentiform nuclei, internal capsule, insula, M1-M3 cortex): 3 - Supraganglionic infarction (M4-M6  cortex): 0 Total score (0-10 with 10 being normal): 3 IMPRESSION: Acute infarction in the right middle cerebral artery territory. Cortical and subcortical low-density at the margins of the infarction, with diminished gray-white differentiation elsewhere within the MCA territory. Minimal petechial blood products in the area of the right frontal portion of the  infarction. No mass effect. Aspects score 3. These results were communicated to Dr. Curly Shores At 12:37 pmon 1/27/2022by text page via the [facility] messaging system. Electronically Signed   By: Nelson Chimes M.D.   On: 12/29/2020 12:38   VAS [place] LOWER EXTREMITY VENOUS (DVT)  Result Date: 12/30/2020  Lower Venous DVT Study Indications: Stroke.  Limitations: Recent RT femoral catheterization- limited visibility of RT CFV. Comparison Study: No prior studies. Performing Technologist: Darlin Coco RDMS  Examination Guidelines: A complete evaluation includes B-mode imaging, spectral Doppler, color Doppler, and power Doppler as needed of all accessible portions of each vessel. Bilateral testing is considered an integral part of a complete examination. Limited examinations for reoccurring indications may be performed as noted. The reflux portion of the exam is performed with the patient in reverse Trendelenburg.  +---------+---------------+---------+-----------+----------+--------------+ RIGHT    CompressibilityPhasicitySpontaneityPropertiesThrombus Aging +---------+---------------+---------+-----------+----------+--------------+ CFV                     Yes      Yes                                 +---------+---------------+---------+-----------+----------+--------------+ FV Prox  Full                                                        +---------+---------------+---------+-----------+----------+--------------+ FV Mid   Full                                                        +---------+---------------+---------+-----------+----------+--------------+ FV DistalFull                                                        +---------+---------------+---------+-----------+----------+--------------+ PFV      Full                                                        +---------+---------------+---------+-----------+----------+--------------+ POP      Full           Yes      Yes                                  +---------+---------------+---------+-----------+----------+--------------+ PTV      Full                                                        +---------+---------------+---------+-----------+----------+--------------+  PERO     Full                                                        +---------+---------------+---------+-----------+----------+--------------+   +---------+---------------+---------+-----------+----------+--------------+ LEFT     CompressibilityPhasicitySpontaneityPropertiesThrombus Aging +---------+---------------+---------+-----------+----------+--------------+ CFV      Full           Yes      Yes                                 +---------+---------------+---------+-----------+----------+--------------+ SFJ      Full                                                        +---------+---------------+---------+-----------+----------+--------------+ FV Prox  Full                                                        +---------+---------------+---------+-----------+----------+--------------+ FV Mid   Full                                                        +---------+---------------+---------+-----------+----------+--------------+ FV DistalFull                                                        +---------+---------------+---------+-----------+----------+--------------+ PFV      Full                                                        +---------+---------------+---------+-----------+----------+--------------+ POP      Full           Yes      Yes                                 +---------+---------------+---------+-----------+----------+--------------+ PTV      Full                                                        +---------+---------------+---------+-----------+----------+--------------+ PERO     Full                                                         +---------+---------------+---------+-----------+----------+--------------+  Summary: RIGHT: - There is no evidence of deep vein thrombosis in the lower extremity. However, portions of this examination were limited- see technologist comments above.  - No cystic structure found in the popliteal fossa.  LEFT: - There is no evidence of deep vein thrombosis in the lower extremity.  - No cystic structure found in the popliteal fossa.  *See table(s) above for measurements and observations. Electronically signed by Jamelle Haring on 12/30/2020 at 5:20:23 PM.    Final     PHYSICAL EXAM   Temp:  [98 F (36.7 C)-98.7 F (37.1 C)] 98.7 F (37.1 C) (01/29 0400) Pulse Rate:  [71-94] 89 (01/29 0800) Resp:  [10-28] 19 (01/29 0800) BP: (120-148)/(71-98) 122/78 (01/29 0700) SpO2:  [93 %-99 %] 96 % (01/29 0800)  General - Well nourished, well developed, in no apparent distress.  Ophthalmologic - fundi not visualized due to noncooperation.  Cardiovascular - Regular rhythm and rate.  Mental Status -  Level of arousal and orientation to time, place, and person were intact. Language including expression, naming, repetition, comprehension was assessed and found intact.  Cranial Nerves II - XII - II - Visual field intact OU. III, IV, VI - Extraocular movements intact, mild right gaze preference. V - Facial sensation intact bilaterally. VII - mild right facial weakness. VIII - Hearing & vestibular intact bilaterally. X - Palate elevates symmetrically. XI - Chin turning & shoulder shrug intact bilaterally. XII - Tongue protrusion intact.  Motor Strength - The patient's strength was normal in right upper and lower extremities, however, left UE proximal 4/5, distal wrist extension 2/5 and finger grip 2/5. Left LE 4+/5 proximal and 4-/5 ankle PF/DF.  Bulk was normal and fasciculations were absent.   Motor Tone - Muscle tone was assessed at the neck and appendages and was normal.  Reflexes - The patient's  reflexes were symmetrical in all extremities and he had no pathological reflexes.  Sensory -reduced to pinprick, light touch and pain on the left side.  Coordination - The patient had normal movements in the hands with no ataxia or dysmetria.  Tremor was absent.  Gait and Station - deferred.   ASSESSMENT/PLAN [name] is a 36 y.o. male with history of remote cocaine use, HTN, anxiety and depression admitted for found down with right gaze and left weakness. No tPA given due to unclear time onset. S/p IR for right ICA thrombus.     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 Luminal irregularity at the right distal M2/M3/MCA may represent  subocclusive clot.  MRI right MCA scattered patchy infarcts with petechial hemorrhage  CT repeat - Patchy acute cortical and subcortical infarcts, within the right cerebral hemisphere in the right MCA territory and right MCA/ACA and MCA/PCA watershed territories  2D Echo EF 55-60%  TCD bubble study no PFO  LE venous doppler - no DVT  LDL 51  HgbA1c 5.5  Hypercoagulable work up pending  lovenox for VTE prophylaxis  No antithrombotic prior to admission, now on aspirin 325 mg daily. No DAPT now given petechial hemorrhage seen on CT.   Ongoing aggressive stroke risk factor management  Therapy recommendations:  CIR  Disposition:  pending  QT prolongation  Likely acquired from his home medication including hydroxyzine, trazodone and effexor  QTc ranging from 574->634  In 2013 only 416  Hold off home meds for now  Anxiety and depression  Lately getting worse for  the last 2 weeks  PCP increased effexor from 100->150  Also on home hydroxyzine and trazodone  Currently all home meds on hold due to QTc prolongation  Pt not able to have buspar given prior side effects  Add clonazepam 0.5mg  bid  Hypertension . Stable . BP goal < 160 given petechial hemorrhage  Long term BP goal normotensive  Hyperlipidemia  Home meds:  none   LDL 51, goal < 70  Now on lipitor 20 - no high intensity given LDL at goal  Continue statin at discharge  Tobacco abuse  Current smoker  Smoking cessation counseling provided  Nicotine patch provided  Pt is willing to quit  Other Stroke Risk Factors  Brother died at age of 72 due to enlarged heart with ? clot  Remote cocaine use - sober since age 23 (UDS - negative)  Other Active Problems  Hypokalemia - K 2.7-> supplement ->2.5 supplemented - recheck in AM  Hyponatremia Na 128->125->130->131->132  Mild thrombocytopenia - platelet 128->163 - resolved  Critical Care signed off 12/31/20  UA on 1/29 - not c/w UTI  Cortisol level - pending  Greatly elevated homocystine level.  Folic acid started and vitamin B12 obtained.  Hospital day # 2  This patient is critically ill due to stroke post procedure and at significant risk of neurological worsening, death form severe anemia, bleeding, recurrent stroke, intracranial stenosis. This patient's care requires constant monitoring of vital signs, hemodynamics, respiratory and cardiac monitoring, review of multiple databases, neurological assessment, other specialists and medical decision making of high complexity. I spent 35 minutes of neurocritical care time in the care of this patient.        To contact Stroke Continuity provider, please refer to [email or web address] After hours, contact General Neurology

## 2020-12-31 NOTE — PMR Pre-admission (Shared)
PMR Admission Coordinator Pre-Admission Assessment  Patient: Antonio Rogers is an 35 y.o., male MRN: [id number] DOB: 07/02/1986 Height: 6\' 1"  (185.4 cm) Weight: 99.8 kg              Insurance Information HMO:     PPO: yes     PCP:      IPA:      80/20:      OTHER:  PRIMARY: Pharmacologist      Policy#: [id number]      Subscriber: Pt.  CM Name:       Phone#: ***     Fax#: *** Pre-Cert#: ***      Employer: *** Benefits:  Phone #: ***     Name: *** Eff. Date: ***     Deduct: ***      Out of Pocket Max: ***      Life Max: ***  CIR: ***      SNF: *** Outpatient: ***     Co-Pay: *** Home Health: ***      Co-Pay: *** DME: ***     Co-Pay: *** Providers: *** SECONDARY:       Policy#:       Phone#:   Financial Counselor:       Phone#:   The "Data Collection Information Summary" for patients in Inpatient Rehabilitation Facilities with attached "Privacy Act [place] Records" was provided and verbally reviewed with: Patient  Emergency Contact Information Contact Information    Name Relation Home Work [place], [place] Father   [phone]   Rogers, Antonio Mother   [phone]     Current Medical History  Patient Admitting Diagnosis:CVA  History of Present Illness: 35 year old with a history of remote cocaine abuse admitted with right-sided watershed infarcts, right carotid artery thrombus , status post thrombectomy by neuro IR on 12/29/2020  Complete NIHSS TOTAL: 3 Glasgow Coma Scale Score: 15  Past Medical History  Past Medical History:  Diagnosis Date  . Anxiety   . Chicken pox   . Depression   . Drug abuse and dependence ([place])    cocaine   . Hypertension    high blood pressure readings    Family History  family history includes Drug abuse in his maternal uncle and paternal uncle; High blood pressure in his father; Hypercholesterolemia in his father; Schizophrenia in his maternal grandfather; Sudden Cardiac Death in his brother; Thrombosis in his  brother.  Prior Rehab/Hospitalizations:  Has the patient had prior rehab or hospitalizations prior to admission? No  Has the patient had major surgery during 100 days prior to admission? Yes  Current Medications   Current Facility-Administered Medications:  .   stroke: mapping our early stages of recovery book, , Does not apply, Once, Bhagat, Srishti L, MD .  0.9 %  sodium chloride infusion, , Intravenous, Continuous, Bhagat, Srishti L, MD .  acetaminophen (TYLENOL) tablet 650 mg, 650 mg, Oral, Q4H PRN **OR** acetaminophen (TYLENOL) 160 MG/5ML solution 650 mg, 650 mg, Per Tube, Q4H PRN **OR** acetaminophen (TYLENOL) suppository 650 mg, 650 mg, Rectal, Q4H PRN, Bhagat, Srishti L, MD .  aspirin EC tablet 325 mg, 325 mg, Oral, Daily, Rosalin Hawking, MD, 325 mg at 12/31/20 0933 .  atorvastatin (LIPITOR) tablet 20 mg, 20 mg, Oral, Daily, Rosalin Hawking, MD, 20 mg at 12/31/20 0933 .  calcium gluconate 1 g/ 50 mL sodium chloride IVPB, 1 g, Intravenous, Once, Priscella Mann, RPH .  Chlorhexidine Gluconate Cloth 2 % PADS 6 each, 6  each, Topical, Daily, Kerney Elbe, MD .  cholecalciferol (VITAMIN D3) tablet 1,000 Units, 1,000 Units, Oral, Daily, Rosalin Hawking, MD, 1,000 Units at 12/31/20 0933 .  clonazePAM (KLONOPIN) tablet 0.5 mg, 0.5 mg, Oral, BID PRN, Rosalin Hawking, MD, 0.5 mg at 12/31/20 0933 .  enoxaparin (LOVENOX) injection 40 mg, 40 mg, Subcutaneous, Q24H, Rosalin Hawking, MD, 40 mg at 12/31/20 1303 .  folic acid (FOLVITE) tablet 2 mg, 2 mg, Oral, Daily, [place], Kofi, MD, 2 mg at 12/31/20 1114 .  iohexol (OMNIPAQUE) 300 MG/ML solution 150 mL, 150 mL, Intra-arterial, Once PRN, Deveshwar, Sanjeev, MD .  iohexol (OMNIPAQUE) 300 MG/ML solution 50 mL, 50 mL, Intra-arterial, Once PRN, Deveshwar, Sanjeev, MD .  labetalol (NORMODYNE) injection 10-20 mg, 10-20 mg, Intravenous, Q2H PRN, Rigoberto Noel, MD, 20 mg at 12/30/20 0757 .  magnesium sulfate IVPB 2 g 50 mL, 2 g, Intravenous, Once, Freda Jackson B, MD,  Last Rate: 50 mL/hr at 12/31/20 1413, 2 g at 12/31/20 1413 .  nicotine (NICODERM CQ - dosed in mg/24 hours) patch 14 mg, 14 mg, Transdermal, Daily, Freddi Starr, MD, 14 mg at 12/31/20 0933 .  pantoprazole (PROTONIX) EC tablet 40 mg, 40 mg, Oral, Daily, Rosalin Hawking, MD, 40 mg at 12/31/20 0933 .  senna-docusate (Senokot-S) tablet 1 tablet, 1 tablet, Oral, QHS PRN, Bhagat, Srishti L, MD  Patients Current Diet:  Diet Order            Diet Heart Room service appropriate? Yes; Fluid consistency: Thin  Diet effective now                 Precautions / Restrictions Precautions Precautions: Fall Precaution Comments: L inattention Restrictions Weight Bearing Restrictions: No   Has the patient had 2 or more falls or a fall with injury in the past year?No  Prior Activity Level Community (5-7x/wk): pt. was active in the community PTA  Prior Functional Level Prior Function Level of Independence: Independent Comments: not working  Self Care: Did the patient need help bathing, dressing, using the toilet or eating?  Independent  Indoor Mobility: Did the patient need assistance with walking from room to room (with or without device)? Independent  Stairs: Did the patient need assistance with internal or external stairs (with or without device)? Independent  Functional Cognition: Did the patient need help planning regular tasks such as shopping or remembering to take medications? Independent  Home Assistive Devices / Equipment Home Equipment: None  Prior Device Use: Indicate devices/aids used by the patient prior to current illness, exacerbation or injury? None of the above  Current Functional Level Cognition  Arousal/Alertness: Awake/alert Overall Cognitive Status: Impaired/Different from baseline Orientation Level: Oriented X4 Safety/Judgement: Decreased awareness of deficits (L inattention) Attention: Focused,Sustained Focused Attention: Impaired Focused Attention  Impairment: Verbal complex Sustained Attention: Impaired Sustained Attention Impairment: Verbal complex Memory: Impaired Memory Impairment: Retrieval deficit (Immediate & delayed: 5/5; paragraph: 6/8) Awareness: Impaired Awareness Impairment: Emergent impairment Problem Solving: Impaired Problem Solving Impairment: Verbal complex Executive Function: Sequencing,Reasoning,Organizing Reasoning: Appears intact Sequencing: Impaired Sequencing Impairment: Verbal complex (Clock drawing: 0/4) Organizing: Impaired Organizing Impairment: Verbal complex (backward digit span: 1/2)    Extremity Assessment (includes Sensation/Coordination)  Upper Extremity Assessment: Defer to OT evaluation LUE Deficits / Details: 3 out 5 MMT shoulder 90 degrees against gravity, able to extend elbow and wrist ( limited by Aline in wrist) able to extend digits but not full extension LUE Sensation: decreased light touch (feels numb) LUE Coordination: decreased fine motor,decreased gross motor  Lower Extremity Assessment: LLE deficits/detail LLE Deficits / Details: grossly 4/5 LLE    ADLs  Overall ADL's : Needs assistance/impaired Eating/Feeding: Sitting,Moderate assistance Grooming: Wash/dry hands,Wash/dry face,Moderate assistance,Sitting Lower Body Dressing: Moderate assistance,Sit to/from stand Lower Body Dressing Details (indicate cue type and reason): unable to use L hand to hold sock. pt able to figure 4 cross and with Min ()A for balance at EOB and thread R over toes to have pt pull up. pt able to hook and don L with sitting in bed criss cross apple sauce Toilet Transfer: +2 for physical assistance,Minimal assistance Functional mobility during ADLs: +2 for physical assistance,Minimal assistance General ADL Comments: pt needs cues for L visual field. pt rolling nearly to off eob and lack awareness    Mobility  Overal bed mobility: Needs Assistance Bed Mobility: Supine to Sit Rolling: Min guard Supine to  sit: Supervision,HOB elevated General bed mobility comments: pt attempts to laterally scoot hips toward edge of bed initially, requires cues to attend to edge of bed. Then sits into long sitting and pivots hips toward edge. Pt requires physical assistance and cues to maintain safety.    Transfers  Overall transfer level: Needs assistance Equipment used: None Transfers: Sit to/from Stand Sit to Stand: Min guard General transfer comment: pt requires cues to guard L wrist due to aline placement    Ambulation / Gait / Stairs / Wheelchair Mobility  Ambulation/Gait Ambulation/Gait assistance: Counsellor (Feet): 150 Feet Assistive device: None Gait Pattern/deviations: Step-through pattern General Gait Details: pt with slowed step-through gait. Pt is unable to significantly change gait speed upon PT request. Pt able to perform head turns to right but only minimally to left side. Gait velocity: reduced Gait velocity interpretation: <1.8 ft/sec, indicate of risk for recurrent falls    Posture / Balance Dynamic Sitting Balance Sitting balance - Comments: R gaze and trunk rotation. pt with decrease awareness to midline Static Standing Balance Single Leg Stance - Left Leg: 2 Balance Overall balance assessment: Needs assistance Sitting-balance support: No upper extremity supported,Feet supported Sitting balance-Leahy Scale: Good Sitting balance - Comments: R gaze and trunk rotation. pt with decrease awareness to midline Standing balance support: No upper extremity supported Standing balance-Leahy Scale: Fair Standing balance comment: minG with or without UE support Single Leg Stance - Left Leg: 2 High Level Balance Comments: pt requires minA to pick object up from floor    Special needs/care consideration Skin *** and Designated visitor ***     Previous Home Environment (from acute therapy documentation) Living Arrangements: Alone  Lives With: Other (Comment) (with  dog) Available Help at Discharge: Family,Available 24 hours/day Type of Home: House Home Layout: Multi-level (split level 4  steps down den, 5 step to bedroom-- 9 total from lowest level to top level) Alternate Level Stairs-Rails: Right,Left (opposite sides for the different stairs) Home Access: Level entry Bathroom Shower/Tub: Multimedia programmer: Standard Bathroom Accessibility: Yes How Accessible: Accessible via walker [place]: No Additional Comments: pitbull Lucy- able to let the dog out without walking  Discharge Living Setting Plans for Discharge Living Setting: House Type of Home at Discharge: House Discharge Home Layout: One level Discharge Home Access: Stairs to enter Entrance Stairs-Rails: Can reach both,Left,Right Entrance Stairs-Number of Steps: 4 Discharge Bathroom Shower/Tub: Walk-in shower Discharge Bathroom Toilet: Standard Discharge Bathroom Accessibility: Yes How Accessible: Accessible via walker Does the patient have any problems obtaining your medications?: No  Social/Family/Support Systems Patient Roles: Parent Contact Information: [phone] Anticipated Caregiver:  Saqib Cazarez (father) Anticipated Caregiver's Contact Information: [phone] Ability/Limitations of Caregiver: Can provide min-mod A Caregiver Availability: 24/7 Discharge Plan Discussed with Primary Caregiver: Yes Is Caregiver In Agreement with Plan?: Yes Does Caregiver/Family have Issues with Lodging/Transportation while Pt is in Rehab?: No   Goals Patient/Family Goal for Rehab: PT/OT mod I Expected length of stay: 7-10 days Pt/Family Agrees to Admission and willing to participate: Yes Program Orientation Provided & Reviewed with Pt/Caregiver Including Roles  & Responsibilities: Yes   Decrease burden of Care through IP rehab admission: Specialzed equipment needs, Decrease number of caregivers, Bowel and bladder program and Patient/family education   Possible  need for SNF placement upon discharge:not anticipated   Patient Condition: {PATIENT'S CONDITION:22832}  Preadmission Screen Completed By:  Genella Mech, CCC-SLP, 12/31/2020 3:10 PM ______________________________________________________________________   Discussed status with Dr. Marland Kitchenon***at *** and received approval for admission today.  Admission Coordinator:  Genella Mech, time***/Date***

## 2020-12-31 NOTE — Progress Notes (Signed)
CRITICAL VALUE ALERT  Critical Value:  Potassium 2.5  Date & Time Notied:  12/31/20 0620  Provider Notified: Lucile Shutters  Orders Received/Actions taken: awaiting orders

## 2020-12-31 NOTE — Progress Notes (Signed)
Physical Therapy Treatment Patient Details Name: Antonio Rogers MRN: [id number] DOB: Aug 29, 1986 Today's Date: 12/31/2020    History of Present Illness 35 year old with a history of remote cocaine abuse admitted with right-sided watershed infarcts, right carotid artery thrombus , status post thrombectomy by neuro IR on 12/29/2020.    PT Comments    Pt tolerates treatment well, demonstrating some improvement in bed mobility this session. Pt continues to demonstrate significant L inattention, unaware of many objects on left side when ambulating and continues to have difficulty turning his head and tracking to left without verbal cues. Pt also demonstrates deficits in higher level balance tasks at this time. Pt expresses great concern over continued LUE weakness, unable to utilize his LUE effectively to don mask due to impaired grip strength and coordination. Pt will continue to benefit from aggressive mobilization and PT POC to improve awareness of L side and to restore independence. PT continues to recommend CIR at this time.   Follow Up Recommendations  CIR     Equipment Recommendations  Other (comment) (shower seat)    Recommendations for Other Services       Precautions / Restrictions Precautions Precautions: Fall Precaution Comments: L inattention Restrictions Weight Bearing Restrictions: No    Mobility  Bed Mobility Overal bed mobility: Needs Assistance Bed Mobility: Supine to Sit     Supine to sit: Supervision;HOB elevated        Transfers Overall transfer level: Needs assistance Equipment used: None Transfers: Sit to/from Stand Sit to Stand: Min guard            Ambulation/Gait Ambulation/Gait assistance: Min guard Gait Distance (Feet): 150 Feet Assistive device: None Gait Pattern/deviations: Step-through pattern Gait velocity: reduced Gait velocity interpretation: <1.8 ft/sec, indicate of risk for recurrent falls General Gait Details: pt with  slowed step-through gait. Pt is unable to significantly change gait speed upon PT request. Pt able to perform head turns to right but only minimally to left side.   Stairs             Wheelchair Mobility    Modified Rankin (Stroke Patients Only) Modified Rankin (Stroke Patients Only) Pre-Morbid Rankin Score: No symptoms Modified Rankin: Moderately severe disability     Balance Overall balance assessment: Needs assistance Sitting-balance support: No upper extremity supported;Feet supported Sitting balance-Leahy Scale: Good     Standing balance support: No upper extremity supported Standing balance-Leahy Scale: Fair     Single Leg Stance - Left Leg: 2           High Level Balance Comments: pt requires minA to pick object up from floor            Cognition Arousal/Alertness: Awake/alert Behavior During Therapy: WFL for tasks assessed/performed Overall Cognitive Status: Impaired/Different from baseline Area of Impairment: Safety/judgement                         Safety/Judgement: Decreased awareness of deficits (L inattention)            Exercises      General Comments General comments (skin integrity, edema, etc.): VSS on RA. Pt continues to demonstrate left inattention, only identifying objects on left side that are slightly left of midline during ambulation, reporting he did not recognize those objects after turning around and having them on right side.      Pertinent Vitals/Pain Pain Assessment: No/denies pain    Home Living  Prior Function            PT Goals (current goals can now be found in the care plan section) Acute Rehab PT Goals Patient Stated Goal: to return to independent mobility Progress towards PT goals: Progressing toward goals    Frequency    Min 4X/week      PT Plan Current plan remains appropriate    Co-evaluation              AM-PAC PT "6 Clicks" Mobility   Outcome  Measure  Help needed turning from your back to your side while in a flat bed without using bedrails?: A Little Help needed moving from lying on your back to sitting on the side of a flat bed without using bedrails?: A Little Help needed moving to and from a bed to a chair (including a wheelchair)?: A Little Help needed standing up from a chair using your arms (e.g., wheelchair or bedside chair)?: A Little Help needed to walk in hospital room?: A Little Help needed climbing 3-5 steps with a railing? : A Lot 6 Click Score: 17    End of Session Equipment Utilized During Treatment: Gait belt Activity Tolerance: Patient tolerated treatment well Patient left: in bed;with call bell/phone within reach;with bed alarm set Nurse Communication: Mobility status PT Visit Diagnosis: Other abnormalities of gait and mobility (R26.89);Other symptoms and signs involving the nervous system (R29.898);Hemiplegia and hemiparesis Hemiplegia - Right/Left: Left Hemiplegia - caused by: Cerebral infarction     Time: 4010-2725 PT Time Calculation (min) (ACUTE ONLY): 20 min  Charges:  $Gait Training: 8-22 mins                     Zenaida Niece, PT, DPT Acute Rehabilitation Pager: [phone]    Zenaida Niece 12/31/2020, 1:20 PM

## 2020-12-31 NOTE — Progress Notes (Signed)
NAME:  Antonio Rogers, MRN:  [id number], DOB:  [birth year], LOS: 2 ADMISSION DATE:  12/29/2020, CONSULTATION DATE:  12/31/2020  REFERRING MD:  Curly Shores, MD, CHIEF COMPLAINT:  weakness   Brief History:  35 year old with a history of remote cocaine abuse admitted with right-sided watershed infarcts, right carotid artery thrombus , status post thrombectomy by neuro IR  Apparently parents found him at 12 AM after they received a text from him, having difficulty out of bed for 2 days.  He was found on the floor, brought in by EMS.  Code stroke eventually activated.  CT angiogram showed free-floating thrombus in the right carotid artery with suspicion of possible carotid dissection.  MRI confirmed patchy infarcts in the right cerebral hemisphere as well as MCA/PCA and MCA/ACA watershed territories Underwent complete revascularization of right ICA long segment thrombus and revascularization of right MCA, brought to PACU intubated where he was subsequently extubated.  PCCM consulted for further management  He was noted to be hyponatremic, hypocalcemic and hypokalemic  1/28: He reports a few days of severe constipation prior to coming in. He had decreased PO intake of solids and fluids.   Past Medical History:  Tobacco abuse Remote history of cocaine abuse Anxiety Hypertension  Significant Hospital Events:    Consults:  Neurology Neuro IR  Procedures:  1/27 neuro IR S/P  Bilateral common carotid and Lt Vert A angiograms. RT CFA approach. S/P complete revascularization of neat occlusive RT ICA due to long seg thrombus   Significant Diagnostic Tests:  CTA head and neck 1/27 Thrombus in the right carotid bulb extending superiorly along the proximal and mid cervical segment of the right ICA resulting in severe stenosis.  Luminal irregularity at the right distal M2/M3/MCA may represent subocclusive clot  MRI 1/27 Patchy acute cortical/subcortical infarcts within the right  cerebral hemisphere within the right MCA territory as well as MCA/PCA and MCA/ACA watershed territories  [employer] Data:    Antimicrobials:     Interim History / Subjective:  No acute events overnight.   Patient without complaints this AM.  Urine studies returned, Na >40 and Osm >100  Objective   Blood pressure 122/78, pulse 80, temperature 98.7 F (37.1 C), temperature source Oral, resp. rate (!) 23, height 6\' 1"  (1.854 m), weight 99.8 kg, SpO2 97 %.        Intake/Output Summary (Last 24 hours) at 12/31/2020 0745 Last data filed at 12/31/2020 0000 Gross per 24 hour  Intake 1325.82 ml  Output 500 ml  Net 825.82 ml   Filed Weights   12/29/20 1249  Weight: 99.8 kg    Examination: General: alert, resting in bed, no acute distress, young male HENT: PERRL, sclera anicteric, moist mucous membranes Lungs: Clear breath sounds bilateral. No wheezing or rhonchi. 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  - Neuro IR following - Antiplatelet agents and further recommendations per their services  Hypertension - PRN labetalol ordered - Goal BP 120-140 sBP  Hyponatremia  - Urine Na 47, Urine Osm 713. Possibly SIADH vs adrenal insufficiency. Possibly related to effexor use. - TSH within normal limits - Check AM cortisol today  Hypocalcemia Hypokalemia - replete as needed  Prolonged QTC -  EKG 1/28 with QTc >500  Alcohol Abuse - last drink 12/22/20 - Not showing signs of withdrawal, will monitor  Depression - holding home medications  PCCM team will now sign off. Please call or message if there are any further  questions or concerns.   Best practice (evaluated daily)  Diet: heart healthy Pain/Anxiety/Delirium protocol (if indicated): N/A  DVT prophylaxis: Lovenox when okay with neurology GI prophylaxis: Protonix Glucose control: N/A Mobility: PT Disposition: per primary team  Goals of Care:   Code Status: Full  Labs   CBC: Recent Labs  Lab 12/29/20 1222 12/29/20 1229 12/30/20 0316 12/31/20 0353  WBC 7.0  --  5.8 7.4  NEUTROABS 4.8  --  3.2  --   HGB 15.2 16.3 12.5* 12.0*  HCT 43.2 48.0 34.4* 33.7*  MCV 94.1  --  94.2 94.7  PLT 133*  --  128* [id number]    Basic Metabolic Panel: Recent Labs  Lab 12/29/20 1222 12/29/20 1229 12/29/20 1317 12/29/20 2030 12/29/20 2316 12/30/20 0316 12/30/20 0821 12/31/20 0353  NA 128* 125*  --  130* 131* 131* 131* 132*  K 2.8* 3.6  --   --   --  2.7*  --  2.5*  CL 81* 80*  --   --   --  88*  --  93*  CO2 29  --   --   --   --  25  --  26  GLUCOSE 165* 163*  --   --   --  165*  --  160*  BUN 28* 42*  --   --   --  21*  --  15  CREATININE 1.11 0.80  --   --   --  0.85  --  0.75  CALCIUM 7.9*  --   --   --   --  7.1*  --  7.5*  MG  --   --  2.0  --   --   --   --  1.9   GFR: Estimated Creatinine Clearance: 158.7 mL/min (by C-G formula based on SCr of 0.75 mg/dL). Recent Labs  Lab 12/29/20 1222 12/30/20 0316 12/31/20 0353  WBC 7.0 5.8 7.4    Liver Function Tests: Recent Labs  Lab 12/29/20 1222 12/30/20 0316 12/31/20 0353  AST 43* 32 49*  ALT 21 18 18   ALKPHOS 79 61 70  BILITOT 2.2* 1.5* 0.9  PROT 6.3* 5.3* 5.2*  ALBUMIN 2.8* 2.3* 2.4*   No results for input(s): LIPASE, AMYLASE in the last 168 hours. No results for input(s): AMMONIA in the last 168 hours.  ABG    Component Value Date/Time   TCO2 35 (H) 12/29/2020 1229     Coagulation Profile: Recent Labs  Lab 12/29/20 1222  INR 1.3*    Cardiac Enzymes: Recent Labs  Lab 12/29/20 2030  CKTOTAL 43*    HbA1C: Hgb A1c MFr Bld  Date/Time Value Ref Range Status   12/30/2020 05:20 AM 5.5 4.8 - 5.6 % Final    Comment:    (NOTE) Pre diabetes:          5.7%-6.4%  Diabetes:              >6.4%  Glycemic control for   <7.0% adults with diabetes     CBG: No results for input(s): GLUCAP in the last 168 hours.   Freda Jackson, MD [facility] Office: [phone]  If no response to pager , please call [phone] until 7 pm  After 7:00 pm call Elink  [phone]   12/31/2020

## 2020-12-31 NOTE — Progress Notes (Signed)
Inpatient Rehab Admissions Coordinator:   Met with patient at bedside to discuss potential CIR admission. Pt. Stated interest. I spoke with pt.'s parents over the phone and they confirm that they will provide 24/7 support at d/c. Will pursue for potential admit next week, pending bed availability and insurance authorization.  Clemens Catholic, [place], [place] Admissions Coordinator  [phone] ([place]) [phone] (office)

## 2021-01-01 LAB — COMPREHENSIVE METABOLIC PANEL
ALT: 21 U/L (ref 0–44)
AST: 41 U/L (ref 15–41)
Albumin: 2.6 g/dL — ABNORMAL LOW (ref 3.5–5.0)
Alkaline Phosphatase: 87 U/L (ref 38–126)
Anion gap: 12 (ref 5–15)
BUN: 11 mg/dL (ref 6–20)
CO2: 22 mmol/L (ref 22–32)
Calcium: 8.3 mg/dL — ABNORMAL LOW (ref 8.9–10.3)
Chloride: 97 mmol/L — ABNORMAL LOW (ref 98–111)
Creatinine, Ser: 0.82 mg/dL (ref 0.61–1.24)
GFR, Estimated: >60 mL/min (ref >60)
Glucose, Bld: 135 mg/dL — ABNORMAL HIGH (ref 70–99)
Potassium: 3.7 mmol/L (ref 3.5–5.1)
Sodium: 131 mmol/L — ABNORMAL LOW (ref 135–145)
Total Bilirubin: 1 mg/dL (ref 0.3–1.2)
Total Protein: 5.5 g/dL — ABNORMAL LOW (ref 6.5–8.1)

## 2021-01-01 LAB — CBC
HCT: 36.6 % — ABNORMAL LOW (ref 39.0–52.0)
Hemoglobin: 12.8 g/dL — ABNORMAL LOW (ref 13.0–17.0)
MCH: 33.8 pg (ref 26.0–34.0)
MCHC: 35 g/dL (ref 30.0–36.0)
MCV: 96.6 fL (ref 80.0–100.0)
Platelets: 235 10*3/uL (ref 150–400)
RBC: 3.79 MIL/uL — ABNORMAL LOW (ref 4.22–5.81)
RDW: 18.1 % — ABNORMAL HIGH (ref 11.5–15.5)
WBC: 13.3 10*3/uL — ABNORMAL HIGH (ref 4.0–10.5)
nRBC: 0 % (ref 0.0–0.2)

## 2021-01-01 MED ORDER — POTASSIUM CHLORIDE CRYS ER 20 MEQ PO TBCR
40.0000 meq | EXTENDED_RELEASE_TABLET | Freq: Once | ORAL | Status: AC
  Administered 2021-01-01: 40 meq via ORAL
  Filled 2021-01-01: qty 2

## 2021-01-01 MED ORDER — CYANOCOBALAMIN 1000 MCG/ML IJ SOLN
1000.0000 ug | Freq: Every day | INTRAMUSCULAR | Status: DC
  Administered 2021-01-01 – 2021-01-03 (×3): 1000 ug via INTRAMUSCULAR
  Filled 2021-01-01 (×3): qty 1

## 2021-01-01 NOTE — Progress Notes (Signed)
STROKE TEAM PROGRESS NOTE   SUBJECTIVE (INTERVAL HISTORY) Overall he thinks things are about the same.    OBJECTIVE Temp:  [97.9 F (36.6 C)-98.7 F (37.1 C)] 98 F (36.7 C) (01/30 0800) Pulse Rate:  [82-105] 100 (01/30 0800) Cardiac Rhythm: Normal sinus rhythm (01/29 2000) Resp:  [9-32] 9 (01/30 0800) BP: (116-152)/(53-101) 152/93 (01/30 0800) SpO2:  [94 %-100 %] 99 % (01/30 0800)  No results for input(s): GLUCAP in the last 168 hours. Recent Labs  Lab 12/29/20 1222 12/29/20 1229 12/29/20 1317 12/29/20 2030 12/29/20 2316 12/30/20 0316 12/30/20 0821 12/31/20 0353 12/31/20 1548 01/01/21 0642  NA 128* 125*  --    < > 131* 131* 131* 132*  --  131*  K 2.8* 3.6  --   --   --  2.7*  --  2.5* 3.7 3.7  CL 81* 80*  --   --   --  88*  --  93*  --  97*  CO2 29  --   --   --   --  25  --  26  --  22  GLUCOSE 165* 163*  --   --   --  165*  --  160*  --  135*  BUN 28* 42*  --   --   --  21*  --  15  --  11  CREATININE 1.11 0.80  --   --   --  0.85  --  0.75  --  0.82  CALCIUM 7.9*  --   --   --   --  7.1*  --  7.5*  --  8.3*  MG  --   --  2.0  --   --   --   --  1.9  --   --    < > = values in this interval not displayed.   Recent Labs  Lab 12/29/20 1222 12/30/20 0316 12/31/20 0353 01/01/21 0642  AST 43* 32 49* 41  ALT 21 18 18 21   ALKPHOS 79 61 70 87  BILITOT 2.2* 1.5* 0.9 1.0  PROT 6.3* 5.3* 5.2* 5.5*  ALBUMIN 2.8* 2.3* 2.4* 2.6*   Recent Labs  Lab 12/29/20 1222 12/29/20 1229 12/30/20 0316 12/31/20 0353 01/01/21 0642  WBC 7.0  --  5.8 7.4 13.3*  NEUTROABS 4.8  --  3.2  --   --   HGB 15.2 16.3 12.5* 12.0* 12.8*  HCT 43.2 48.0 34.4* 33.7* 36.6*  MCV 94.1  --  94.2 94.7 96.6  PLT 133*  --  128* 163 235   Recent Labs  Lab 12/29/20 2030  CKTOTAL 43*   Recent Labs    12/29/20 1222  LABPROT 15.8*  INR 1.3*   Recent Labs    12/31/20 0132  COLORURINE AMBER*  LABSPEC 1.024  PHURINE 7.0  GLUCOSEU NEGATIVE  HGBUR NEGATIVE  BILIRUBINUR NEGATIVE  KETONESUR  5*  PROTEINUR NEGATIVE  NITRITE NEGATIVE  LEUKOCYTESUR NEGATIVE       Component Value Date/Time   CHOL 93 12/30/2020 0316   TRIG 151 (H) 12/30/2020 0316   HDL 12 (L) 12/30/2020 0316   CHOLHDL 7.8 12/30/2020 0316   VLDL 30 12/30/2020 0316   LDLCALC 51 12/30/2020 0316   Lab Results  Component Value Date   HGBA1C 5.5 12/30/2020      Component Value Date/Time   LABOPIA NONE DETECTED 12/31/2020 0132   COCAINSCRNUR NONE DETECTED 12/31/2020 0132   LABBENZ NONE DETECTED 12/31/2020 0132   AMPHETMU NONE DETECTED 12/31/2020  [place] DETECTED 12/31/2020 0132   LABBARB NONE DETECTED 12/31/2020 0132    Recent Labs  Lab 12/29/20 Gap <10    I have personally reviewed the radiological images below and agree with the radiology interpretations.  CT Code Stroke CTA Head W/WO contrast  Result Date: 12/29/2020 CLINICAL DATA:  Left-sided weakness.  Stroke suspected. EXAM: CT ANGIOGRAPHY HEAD AND NECK TECHNIQUE: Multidetector CT imaging of the head and neck was performed using the standard protocol during bolus administration of intravenous contrast. Multiplanar CT image reconstructions and MIPs were obtained to evaluate the vascular anatomy. Carotid stenosis measurements (when applicable) are obtained utilizing NASCET criteria, using the distal internal carotid diameter as the denominator. CONTRAST:  81mL OMNIPAQUE IOHEXOL 350 MG/ML SOLN COMPARISON:  Head CT December 29, 2020. FINDINGS: CTA NECK FINDINGS Aortic arch: Standard branching. Imaged portion shows no evidence of aneurysm or dissection. No significant stenosis of the major arch vessel origins. Right carotid system: A mural thrombus is seen in the right carotid bulb extending superiorly along the proximal and mid cervical segment of the right ICA resulting in severe stenosis. Left carotid system: Mild atherosclerotic changes. No evidence of dissection, stenosis (50% or greater) or occlusion. Vertebral arteries: Codominant. No  evidence of dissection, stenosis (50% or greater) or occlusion. Skeleton: No acute findings. Other neck: Heterogeneous thyroid gland. Upper chest: Negative Review of the MIP images confirms the above findings CTA HEAD FINDINGS Anterior circulation: Intracranial right ICA is patent. Luminal irregularity at the right distal M2/M3/MCA may represent subocclusive clot. The bilateral ACA and the left MCA vascular trees are patent. Posterior circulation: No significant stenosis, proximal occlusion, aneurysm, or vascular malformation. Venous sinuses: As permitted by contrast timing, patent. Review of the MIP images confirms the above findings IMPRESSION: 1. Thrombus in the right carotid bulb extending superiorly along the proximal and mid cervical segment of the right ICA resulting in severe stenosis. 2. Luminal irregularity at the right distal M2/M3/MCA may represent subocclusive clot. These results were called by telephone at the time of interpretation on 12/29/2020 at 1:10 pm to provider [facility] , who verbally acknowledged these results. Electronically Signed   By: Pedro Earls M.D.   On: 12/29/2020 13:19   CT HEAD WO CONTRAST  Result Date: 12/29/2020 CLINICAL DATA:  Stroke. Neuro deficit, acute, stroke suspected. Post IR thrombectomy. EXAM: CT HEAD WITHOUT CONTRAST TECHNIQUE: Contiguous axial images were obtained from the base of the skull through the vertex without intravenous contrast. COMPARISON:  Brain MRI 12/29/2020, CT angiogram head/neck 12/29/2020, noncontrast head CT 12/29/2020. FINDINGS: Brain: Redemonstrated patchy acute cortical and subcortical infarcts within the right frontal, parietal, occipital and temporal lobes within the right MCA territory and right MCA/ACA and MCA/PCA watershed territories. These infarcts do not appear significantly changed in extent as compared to the brain MRI performed earlier today and are again overall moderate in volume. Unchanged subtle petechial  hemorrhage at site of an acute infarct within the right frontal lobe (series 3, image 20) and right occipital lobe (series 3, image 15). No frank hemorrhagic conversion. No significant mass effect. Background mild ill-defined hypoattenuation within the cerebral white matter is nonspecific, but compatible with chronic small vessel ischemic disease. No extra-axial fluid collection. No evidence of intracranial mass. No midline shift. Vascular: Residual intravascular contrast limits evaluation for hyperdense vessels. Skull: Normal. Negative for fracture or focal lesion. Sinuses/Orbits: Visualized orbits show no acute finding. Mild bilateral ethmoid sinus mucosal thickening. IMPRESSION: Patchy acute cortical and subcortical  infarcts, within the right cerebral hemisphere in the right MCA territory and right MCA/ACA and MCA/PCA watershed territories, not significantly changed in extent from the brain MRI performed earlier today. Unchanged subtle petechial hemorrhage at sites of acute infarct within the right frontal and occipital lobes. No significant mass effect. Background mild chronic small vessel ischemic disease. Mild ethmoid sinus mucosal thickening. Electronically Signed   By: Kellie Simmering DO   On: 12/29/2020 16:32   CT Code Stroke CTA Neck W/WO contrast  Result Date: 12/29/2020 CLINICAL DATA:  Left-sided weakness.  Stroke suspected. EXAM: CT ANGIOGRAPHY HEAD AND NECK TECHNIQUE: Multidetector CT imaging of the head and neck was performed using the standard protocol during bolus administration of intravenous contrast. Multiplanar CT image reconstructions and MIPs were obtained to evaluate the vascular anatomy. Carotid stenosis measurements (when applicable) are obtained utilizing NASCET criteria, using the distal internal carotid diameter as the denominator. CONTRAST:  28mL OMNIPAQUE IOHEXOL 350 MG/ML SOLN COMPARISON:  Head CT December 29, 2020. FINDINGS: CTA NECK FINDINGS Aortic arch: Standard branching. Imaged  portion shows no evidence of aneurysm or dissection. No significant stenosis of the major arch vessel origins. Right carotid system: A mural thrombus is seen in the right carotid bulb extending superiorly along the proximal and mid cervical segment of the right ICA resulting in severe stenosis. Left carotid system: Mild atherosclerotic changes. No evidence of dissection, stenosis (50% or greater) or occlusion. Vertebral arteries: Codominant. No evidence of dissection, stenosis (50% or greater) or occlusion. Skeleton: No acute findings. Other neck: Heterogeneous thyroid gland. Upper chest: Negative Review of the MIP images confirms the above findings CTA HEAD FINDINGS Anterior circulation: Intracranial right ICA is patent. Luminal irregularity at the right distal M2/M3/MCA may represent subocclusive clot. The bilateral ACA and the left MCA vascular trees are patent. Posterior circulation: No significant stenosis, proximal occlusion, aneurysm, or vascular malformation. Venous sinuses: As permitted by contrast timing, patent. Review of the MIP images confirms the above findings IMPRESSION: 1. Thrombus in the right carotid bulb extending superiorly along the proximal and mid cervical segment of the right ICA resulting in severe stenosis. 2. Luminal irregularity at the right distal M2/M3/MCA may represent subocclusive clot. These results were called by telephone at the time of interpretation on 12/29/2020 at 1:10 pm to provider [facility] , who verbally acknowledged these results. Electronically Signed   By: Pedro Earls M.D.   On: 12/29/2020 13:19   MR BRAIN WO CONTRAST  Result Date: 12/29/2020 CLINICAL DATA:  Neuro deficit, acute, stroke suspected. EXAM: MRI HEAD WITHOUT CONTRAST TECHNIQUE: Multiplanar, multiecho pulse sequences of the brain and surrounding structures were obtained without intravenous contrast. COMPARISON:  Noncontrast head CT and CT angiogram head/neck performed earlier today  12/29/2020. FINDINGS: Brain: Cerebral volume is normal for age. There are patchy cortical/subcortical infarcts within the right cerebral hemisphere within the right frontal, parietal, occipital and temporal lobes as well as right insula. These acute infarcts are present within the right MCA territory as well as MCA/PCA and MCA/ACA watershed territories. These infarcts overall moderate in volume. No significant mass effect at this time. There is mild SWI signal loss and T1 hyperintensity associated with acute infarcts within the right frontal and occipital lobes consistent with petechial hemorrhage. Background mild multifocal T2/FLAIR hyperintensity within the cerebral white matter is nonspecific, but compatible with chronic small vessel ischemic disease. No evidence of intracranial mass. No extra-axial fluid collection. No midline shift. Vascular: Expected proximal arterial flow voids. Skull and upper cervical spine:  No focal marrow lesion. Sinuses/Orbits: Visualized orbits show no acute finding. No significant paranasal sinus disease at the imaged levels. IMPRESSION: Patchy acute cortical/subcortical infarcts within the right cerebral hemisphere within the right MCA territory as well as MCA/PCA and MCA/ACA watershed territories. The infarcts are overall moderate in volume. No significant mass effect at this time. Petechial hemorrhage associated with an acute infarcts in the right frontal and right occipital lobes. Background mild chronic small vessel ischemic disease. Electronically Signed   By: Kellie Simmering DO   On: 12/29/2020 14:20   VAS [place] TRANSCRANIAL DOPPLER W BUBBLES  Result Date: 12/31/2020  Transcranial Doppler with Bubble Indications: Stroke. Comparison Study: No prior studies. Performing Technologist: Darlin Coco RDMS  Examination Guidelines: A complete evaluation includes B-mode imaging, spectral Doppler, color Doppler, and power Doppler as needed of all accessible portions of each vessel. Bilateral  testing is considered an integral part of a complete examination. Limited examinations for reoccurring indications may be performed as noted.  Summary: No HITS at rest or during Valsalva. Negative transcranial Doppler Bubble study with no evidence of right to left intracardiac communication.  A vascular evaluation was performed. The right middle cerebral artery was studied. An IV was inserted into the patient's left forearm. Verbal informed consent was obtained.  *See table(s) above for TCD measurements and observations.  Diagnosing physician: Antony Contras MD Electronically signed by Antony Contras MD on 12/31/2020 at 11:11:33 AM.    Final    ECHOCARDIOGRAM COMPLETE  Result Date: 12/30/2020    ECHOCARDIOGRAM REPORT   Patient Name:   Antonio Rogers Date of Exam: 12/30/2020 Medical Rec #:  [id number]             Height:       73.0 in Accession #:    [id number]            Weight:       220.0 lb Date of Birth:  September 27, 1986             BSA:          2.241 m Patient Age:    11 years              BP:           120/76 mmHg Patient Gender: M                     HR:           74 bpm. Exam Location:  Inpatient Procedure: 2D Echo Indications:    Stroke I163.9  History:        Patient has no prior history of Echocardiogram examinations.                 Risk Factors:Hypertension.  Sonographer:    Mikki Santee RDCS (AE) Referring Phys: [id number] [place]  1. Left ventricular ejection fraction, by estimation, is 55 to 60%. The left ventricle has normal function. The left ventricle has no regional wall motion abnormalities. There is mild left ventricular hypertrophy. Left ventricular diastolic parameters are consistent with Grade I diastolic dysfunction (impaired relaxation).  2. Right ventricular systolic function is normal. The right ventricular size is normal.  3. The mitral valve is grossly normal. No evidence of mitral valve regurgitation.  4. The aortic valve is tricuspid. Aortic valve regurgitation  is not visualized.  5. The inferior vena cava is normal in size with <50% respiratory variability, suggesting right atrial pressure of 8 mmHg. Comparison(s):  No prior Echocardiogram. FINDINGS  Left Ventricle: Left ventricular ejection fraction, by estimation, is 55 to 60%. The left ventricle has normal function. The left ventricle has no regional wall motion abnormalities. The left ventricular internal cavity size was normal in size. There is  mild left ventricular hypertrophy. Left ventricular diastolic parameters are consistent with Grade I diastolic dysfunction (impaired relaxation). Indeterminate filling pressures. Right Ventricle: The right ventricular size is normal. No increase in right ventricular wall thickness. Right ventricular systolic function is normal. Left Atrium: Left atrial size was normal in size. Right Atrium: Right atrial size was normal in size. Pericardium: There is no evidence of pericardial effusion. Mitral Valve: The mitral valve is grossly normal. No evidence of mitral valve regurgitation. Tricuspid Valve: The tricuspid valve is grossly normal. Tricuspid valve regurgitation is trivial. Aortic Valve: The aortic valve is tricuspid. Aortic valve regurgitation is not visualized. Pulmonic Valve: The pulmonic valve was not well visualized. Pulmonic valve regurgitation is not visualized. Aorta: The aortic root and ascending aorta are structurally normal, with no evidence of dilitation. Venous: The inferior vena cava is normal in size with less than 50% respiratory variability, suggesting right atrial pressure of 8 mmHg. IAS/Shunts: The interatrial septum was not well visualized.  LEFT VENTRICLE PLAX 2D LVIDd:         4.00 cm  Diastology LVIDs:         3.05 cm  LV e' medial:    6.22 cm/s LV PW:         1.10 cm  LV E/e' medial:  12.5 LV IVS:        1.20 cm  LV e' lateral:   5.61 cm/s LVOT diam:     2.40 cm  LV E/e' lateral: 13.9 LV SV:         77 LV SV Index:   35 LVOT Area:     4.52 cm  RIGHT  VENTRICLE TAPSE (M-mode): 1.2 cm LEFT ATRIUM             Index       RIGHT ATRIUM           Index LA diam:        3.50 cm 1.56 cm/m  RA Area:     13.00 cm LA Vol (A2C):   42.2 ml 18.83 ml/m RA Volume:   23.90 ml  10.67 ml/m LA Vol (A4C):   45.2 ml 20.17 ml/m LA Biplane Vol: 45.9 ml 20.49 ml/m  AORTIC VALVE LVOT Vmax:   88.80 cm/s LVOT Vmean:  66.100 cm/s LVOT VTI:    0.171 m  AORTA Ao Root diam: 3.10 cm MITRAL VALVE MV Area (PHT): 2.95 cm    SHUNTS MV Decel Time: 257 msec    Systemic VTI:  0.17 m MV E velocity: 78.00 cm/s  Systemic Diam: 2.40 cm MV A velocity: 54.00 cm/s MV E/A ratio:  1.44 Lyman Bishop MD Electronically signed by Lyman Bishop MD Signature Date/Time: 12/30/2020/1:43:33 PM    Final    CT HEAD CODE STROKE WO CONTRAST  Result Date: 12/29/2020 CLINICAL DATA:  Code stroke.  Left-sided weakness. EXAM: CT HEAD WITHOUT CONTRAST TECHNIQUE: Contiguous axial images were obtained from the base of the skull through the vertex without intravenous contrast. COMPARISON:  None. FINDINGS: Brain: No abnormality affects the brainstem or cerebellum. The left cerebral hemisphere is normal except for a questionable old white matter infarction in the left parietal region. On the right, there is evidence of acute infarction within the middle cerebral  artery territory. Cortical and subcortical low-density seen at the margins of the MCA distribution, with diminished gray-white differentiation elsewhere within the MCA territory. Minimal petechial blood products in the area the right frontal portion of the infarction. No mass effect. No hydrocephalus. Vascular: No hyperdense vessel. Skull: Normal Sinuses/Orbits: Clear/normal Other: None ASPECTS ([place] Stroke Program Early CT Score) - Ganglionic level infarction (caudate, lentiform nuclei, internal capsule, insula, M1-M3 cortex): 3 - Supraganglionic infarction (M4-M6 cortex): 0 Total score (0-10 with 10 being normal): 3 IMPRESSION: Acute infarction in the right  middle cerebral artery territory. Cortical and subcortical low-density at the margins of the infarction, with diminished gray-white differentiation elsewhere within the MCA territory. Minimal petechial blood products in the area of the right frontal portion of the infarction. No mass effect. Aspects score 3. These results were communicated to Dr. Curly Shores At 12:37 pmon 1/27/2022by text page via the [facility] messaging system. Electronically Signed   By: Nelson Chimes M.D.   On: 12/29/2020 12:38   VAS [place] LOWER EXTREMITY VENOUS (DVT)  Result Date: 12/30/2020  Lower Venous DVT Study Indications: Stroke.  Limitations: Recent RT femoral catheterization- limited visibility of RT CFV. Comparison Study: No prior studies. Performing Technologist: Darlin Coco RDMS  Examination Guidelines: A complete evaluation includes B-mode imaging, spectral Doppler, color Doppler, and power Doppler as needed of all accessible portions of each vessel. Bilateral testing is considered an integral part of a complete examination. Limited examinations for reoccurring indications may be performed as noted. The reflux portion of the exam is performed with the patient in reverse Trendelenburg.  +---------+---------------+---------+-----------+----------+--------------+ RIGHT    CompressibilityPhasicitySpontaneityPropertiesThrombus Aging +---------+---------------+---------+-----------+----------+--------------+ CFV                     Yes      Yes                                 +---------+---------------+---------+-----------+----------+--------------+ FV Prox  Full                                                        +---------+---------------+---------+-----------+----------+--------------+ FV Mid   Full                                                        +---------+---------------+---------+-----------+----------+--------------+ FV DistalFull                                                         +---------+---------------+---------+-----------+----------+--------------+ PFV      Full                                                        +---------+---------------+---------+-----------+----------+--------------+ POP      Full           Yes  Yes                                 +---------+---------------+---------+-----------+----------+--------------+ PTV      Full                                                        +---------+---------------+---------+-----------+----------+--------------+ PERO     Full                                                        +---------+---------------+---------+-----------+----------+--------------+   +---------+---------------+---------+-----------+----------+--------------+ LEFT     CompressibilityPhasicitySpontaneityPropertiesThrombus Aging +---------+---------------+---------+-----------+----------+--------------+ CFV      Full           Yes      Yes                                 +---------+---------------+---------+-----------+----------+--------------+ SFJ      Full                                                        +---------+---------------+---------+-----------+----------+--------------+ FV Prox  Full                                                        +---------+---------------+---------+-----------+----------+--------------+ FV Mid   Full                                                        +---------+---------------+---------+-----------+----------+--------------+ FV DistalFull                                                        +---------+---------------+---------+-----------+----------+--------------+ PFV      Full                                                        +---------+---------------+---------+-----------+----------+--------------+ POP      Full           Yes      Yes                                  +---------+---------------+---------+-----------+----------+--------------+ PTV      Full                                                        +---------+---------------+---------+-----------+----------+--------------+  PERO     Full                                                        +---------+---------------+---------+-----------+----------+--------------+     Summary: RIGHT: - There is no evidence of deep vein thrombosis in the lower extremity. However, portions of this examination were limited- see technologist comments above.  - No cystic structure found in the popliteal fossa.  LEFT: - There is no evidence of deep vein thrombosis in the lower extremity.  - No cystic structure found in the popliteal fossa.  *See table(s) above for measurements and observations. Electronically signed by Jamelle Haring on 12/30/2020 at 5:20:23 PM.    Final     PHYSICAL EXAM   Temp:  [97.9 F (36.6 C)-98.7 F (37.1 C)] 98 F (36.7 C) (01/30 0800) Pulse Rate:  [82-105] 100 (01/30 0800) Resp:  [9-32] 9 (01/30 0800) BP: (116-152)/(53-101) 152/93 (01/30 0800) SpO2:  [94 %-100 %] 99 % (01/30 0800)  General - Well nourished, well developed, in no apparent distress.  Ophthalmologic - fundi not visualized due to noncooperation.  Cardiovascular - Regular rhythm and rate.  Mental Status -  Level of arousal and orientation to time, place, and person were intact. Language including expression, naming, repetition, comprehension was assessed and found intact.  Cranial Nerves II - XII - II - Visual field intact OU. III, IV, VI - Extraocular movements intact, mild right gaze preference. V - Facial sensation intact bilaterally. VII - mild right facial weakness. VIII - Hearing & vestibular intact bilaterally. X - Palate elevates symmetrically. XI - Chin turning & shoulder shrug intact bilaterally. XII - Tongue protrusion intact.  Motor Strength - The patient's strength was normal in right upper and  lower extremities, however, left UE proximal 4/5, distal wrist extension 2/5 and finger grip 2/5. Left LE 4+/5 proximal and 4-/5 ankle PF/DF.  Bulk was normal and fasciculations were absent.   Motor Tone - Muscle tone was assessed at the neck and appendages and was normal.  Reflexes - The patient's reflexes were symmetrical in all extremities and he had no pathological reflexes.  Sensory -reduced to pinprick, light touch and pain on the left side.  Coordination - The patient had normal movements in the hands with no ataxia or dysmetria.  Tremor was absent.  Gait and Station - deferred.   ASSESSMENT/PLAN Mr. Manan Verissimo is a 35 y.o. male with history of remote cocaine use, HTN, anxiety and depression admitted for found down with right gaze and left weakness. No tPA given due to unclear time onset.  S/p IR for right ICA thrombus.     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 Luminal irregularity at the right distal M2/M3/MCA may represent  subocclusive clot.  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 No DAPT now given petechial hemorrhage seen on CT.   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 Stable . BP goal < 160 given petechial hemorrhage  Long term BP goal normotensive  Hyperlipidemia  Home meds:  none   LDL 51, goal < 70  Now on lipitor 20 - no high intensity given LDL at goal  Continue statin at discharge  Tobacco abuse  Current smoker  Smoking cessation counseling provided  Nicotine patch provided  Pt is willing to quit  Hypercoagulable Labs  Cardiolipin antibodies - < 9 - normal  GGT 110 (H)  Prothrombin Gene Mutation - pending   Factor 5 Leiden - pending   Lupus Anticoagulant 44.1 - normal  Beta - 2 Glyco 1 IgG <9 - normal   Homocysteine - 243.4 (H)   DRVVT - 59.8 (H)   DRVVT Mix - 46.2 (H)   DRVVT Confirm - 1.2 - normal   Protein S total - 153 (H)   Protein S Activity - 189 (H)  Protein C total - 64 - normal  Protein C Activity - 75 - normal  Antithrombin III - 87 - normal  Other Stroke Risk Factors  Brother died at age of 28 due to enlarged heart with ? clot  Remote cocaine use - sober since age 32 (UDS - negative)  Other Active Problems  Hypokalemia - K 2.7-> supplement ->2.5 supplemented - recheck in AM ->3.7->3.7  Hyponatremia Na 128->125->130->131->132  Mild thrombocytopenia - platelet 128->163->235 - resolved  Critical Care signed off 12/31/20  UA on 1/29 - not c/w UTI  Cortisol level - 20.6 - within normal range (6.7 - 22.6)  Greatly elevated homocystine  level.  Folic acid started and vitamin B12 obtained (B12 - 248 normal).  Vitamin B12 also started as B12 deficiency often occurs with folic acid deficiency and elevated homocystine level.   Hospital day # 3        To contact Stroke Continuity provider, please refer to [email or web address] After hours, contact General Neurology

## 2021-01-02 ENCOUNTER — Encounter (HOSPITAL_COMMUNITY): Payer: Self-pay | Admitting: Neurology

## 2021-01-02 DIAGNOSIS — I63231 Cerebral infarction due to unspecified occlusion or stenosis of right carotid arteries: Secondary | ICD-10-CM | POA: Diagnosis not present

## 2021-01-02 DIAGNOSIS — I63031 Cerebral infarction due to thrombosis of right carotid artery: Secondary | ICD-10-CM | POA: Diagnosis not present

## 2021-01-02 LAB — COMPREHENSIVE METABOLIC PANEL
ALT: 18 U/L (ref 0–44)
AST: 32 U/L (ref 15–41)
Albumin: 2.4 g/dL — ABNORMAL LOW (ref 3.5–5.0)
Alkaline Phosphatase: 83 U/L (ref 38–126)
Anion gap: 11 (ref 5–15)
BUN: 9 mg/dL (ref 6–20)
CO2: 22 mmol/L (ref 22–32)
Calcium: 8.4 mg/dL — ABNORMAL LOW (ref 8.9–10.3)
Chloride: 98 mmol/L (ref 98–111)
Creatinine, Ser: 0.75 mg/dL (ref 0.61–1.24)
GFR, Estimated: >60 mL/min (ref >60)
Glucose, Bld: 150 mg/dL — ABNORMAL HIGH (ref 70–99)
Potassium: 3.6 mmol/L (ref 3.5–5.1)
Sodium: 131 mmol/L — ABNORMAL LOW (ref 135–145)
Total Bilirubin: 1.1 mg/dL (ref 0.3–1.2)
Total Protein: 5.5 g/dL — ABNORMAL LOW (ref 6.5–8.1)

## 2021-01-02 LAB — CBC
HCT: 34.3 % — ABNORMAL LOW (ref 39.0–52.0)
Hemoglobin: 11.7 g/dL — ABNORMAL LOW (ref 13.0–17.0)
MCH: 33.1 pg (ref 26.0–34.0)
MCHC: 34.1 g/dL (ref 30.0–36.0)
MCV: 96.9 fL (ref 80.0–100.0)
Platelets: 268 10*3/uL (ref 150–400)
RBC: 3.54 MIL/uL — ABNORMAL LOW (ref 4.22–5.81)
RDW: 18.2 % — ABNORMAL HIGH (ref 11.5–15.5)
WBC: 16.5 10*3/uL — ABNORMAL HIGH (ref 4.0–10.5)
nRBC: 0 % (ref 0.0–0.2)

## 2021-01-02 MED ORDER — INFLUENZA VAC SPLIT QUAD 0.5 ML IM SUSY
0.5000 mL | PREFILLED_SYRINGE | INTRAMUSCULAR | Status: AC
  Administered 2021-01-03: 0.5 mL via INTRAMUSCULAR
  Filled 2021-01-02: qty 0.5

## 2021-01-02 NOTE — Progress Notes (Signed)
Received report from nurse Jarrett Soho, RN --4N.PT WILL BE GOING TO ROOM 3W21

## 2021-01-02 NOTE — Progress Notes (Signed)
    [facility] has been requested to perform a transesophageal echocardiogram on Antonio Rogers for Stroke.  After careful review of history and examination, the risks and benefits of transesophageal echocardiogram have been explained including risks of esophageal damage, perforation (1:10,000 risk), bleeding, pharyngeal hematoma as well as other potential complications associated with conscious sedation including aspiration, arrhythmia, respiratory failure and death. Alternatives to treatment were discussed, questions were answered. Patient is willing to proceed.  TEE - Dr. Harrell Gave 01/03/21 @ 930am. NPO after midnight. Meds with sips.   Leanor Kail, PA-C 01/02/2021 1:29 PM

## 2021-01-02 NOTE — Progress Notes (Signed)
Report given to [facility]; pt transported to 3W 21 with all belongings. Update given to pt father Rush Landmark on transfer status.

## 2021-01-02 NOTE — Progress Notes (Signed)
STROKE TEAM PROGRESS NOTE   STROKE TEAM PROGRESS NOTE   SUBJECTIVE (INTERVAL HISTORY) The patient is status post thrombectomy.  He is doing about the same.  No clear worsening or improvement of his symptoms. He is ambulating without difficulty from the bed to the bathroom without assistance. We discussed his stroke diagnosis and plan of care. Questions were answered. Patient stated he had a fall about 10 days prior to his stroke when he slipped on the ice and developed bruising in his rib cage and jerked his neck during the fall. OBJECTIVE Temp:  [98 F (36.7 C)-100.3 F (37.9 C)] 98 F (36.7 C) (01/31 0800) Pulse Rate:  [80-104] 88 (01/31 0710) Cardiac Rhythm: Normal sinus rhythm (01/30 2000) Resp:  [11-34] 23 (01/31 0710) BP: (125-145)/(80-99) 137/94 (01/31 0710) SpO2:  [86 %-100 %] 99 % (01/31 0710)  No results for input(s): GLUCAP in the last 168 hours. Recent Labs  Lab 12/29/20 1222 12/29/20 1229 12/29/20 1317 12/29/20 2030 12/30/20 0316 12/30/20 [id number] 12/31/20 0353 12/31/20 1548 01/01/21 0642 01/02/21 0627  NA 128* 125*  --    < > 131* 131* 132*  --  131* 131*  K 2.8* 3.6  --   --  2.7*  --  2.5* 3.7 3.7 3.6  CL 81* 80*  --   --  88*  --  93*  --  97* 98  CO2 29  --   --   --  25  --  26  --  22 22  GLUCOSE 165* 163*  --   --  165*  --  160*  --  135* 150*  BUN 28* 42*  --   --  21*  --  15  --  11 9  CREATININE 1.11 0.80  --   --  0.85  --  0.75  --  0.82 0.75  CALCIUM 7.9*  --   --   --  7.1*  --  7.5*  --  8.3* 8.4*  MG  --   --  2.0  --   --   --  1.9  --   --   --    < > = values in this interval not displayed.   Recent Labs  Lab 12/29/20 1222 12/30/20 0316 12/31/20 0353 01/01/21 0642 01/02/21 0627  AST 43* 32 49* 41 32  ALT 21 18 18 21 18   ALKPHOS 79 61 70 87 83  BILITOT 2.2* 1.5* 0.9 1.0 1.1  PROT 6.3* 5.3* 5.2* 5.5* 5.5*  ALBUMIN 2.8* 2.3* 2.4* 2.6* 2.4*   Recent Labs  Lab 12/29/20 1222 12/29/20 1229 12/30/20 0316 12/31/20 0353  01/01/21 0642 01/02/21 0627  WBC 7.0  --  5.8 7.4 13.3* 16.5*  NEUTROABS 4.8  --  3.2  --   --   --   HGB 15.2 16.3 12.5* 12.0* 12.8* 11.7*  HCT 43.2 48.0 34.4* 33.7* 36.6* 34.3*  MCV 94.1  --  94.2 94.7 96.6 96.9  PLT 133*  --  128* 163 235 268   Recent Labs  Lab 12/29/20 2030  CKTOTAL 43*   No results for input(s): LABPROT, INR in the last 72 hours. Recent Labs    12/31/20 0132  COLORURINE AMBER*  LABSPEC 1.024  PHURINE 7.0  GLUCOSEU NEGATIVE  HGBUR NEGATIVE  BILIRUBINUR NEGATIVE  KETONESUR 5*  PROTEINUR NEGATIVE  NITRITE NEGATIVE  LEUKOCYTESUR NEGATIVE       Component Value Date/Time   CHOL 93 12/30/2020 0316   TRIG 151 (H)  12/30/2020 0316   HDL 12 (L) 12/30/2020 0316   CHOLHDL 7.8 12/30/2020 0316   VLDL 30 12/30/2020 0316   LDLCALC 51 12/30/2020 0316   Lab Results  Component Value Date   HGBA1C 5.5 12/30/2020      Component Value Date/Time   LABOPIA NONE DETECTED 12/31/2020 0132   COCAINSCRNUR NONE DETECTED 12/31/2020 0132   LABBENZ NONE DETECTED 12/31/2020 0132   AMPHETMU NONE DETECTED 12/31/2020 0132   THCU NONE DETECTED 12/31/2020 0132   LABBARB NONE DETECTED 12/31/2020 0132    Recent Labs  Lab 12/29/20 1222  ETH <10    I have personally reviewed the radiological images below and agree with the radiology interpretations.  CT Code Stroke CTA Head W/WO contrast  Result Date: 12/29/2020 CLINICAL DATA:  Left-sided weakness.  Stroke suspected. EXAM: CT ANGIOGRAPHY HEAD AND NECK TECHNIQUE: Multidetector CT imaging of the head and neck was performed using the standard protocol during bolus administration of intravenous contrast. Multiplanar CT image reconstructions and MIPs were obtained to evaluate the vascular anatomy. Carotid stenosis measurements (when applicable) are obtained utilizing NASCET criteria, using the distal internal carotid diameter as the denominator. CONTRAST:  60mL OMNIPAQUE IOHEXOL 350 MG/ML SOLN COMPARISON:  Head CT December 29, 2020.  FINDINGS: CTA NECK FINDINGS Aortic arch: Standard branching. Imaged portion shows no evidence of aneurysm or dissection. No significant stenosis of the major arch vessel origins. Right carotid system: A mural thrombus is seen in the right carotid bulb extending superiorly along the proximal and mid cervical segment of the right ICA resulting in severe stenosis. Left carotid system: Mild atherosclerotic changes. No evidence of dissection, stenosis (50% or greater) or occlusion. Vertebral arteries: Codominant. No evidence of dissection, stenosis (50% or greater) or occlusion. Skeleton: No acute findings. Other neck: Heterogeneous thyroid gland. Upper chest: Negative Review of the MIP images confirms the above findings CTA HEAD FINDINGS Anterior circulation: Intracranial right ICA is patent. Luminal irregularity at the right distal M2/M3/MCA may represent subocclusive clot. The bilateral ACA and the left MCA vascular trees are patent. Posterior circulation: No significant stenosis, proximal occlusion, aneurysm, or vascular malformation. Venous sinuses: As permitted by contrast timing, patent. Review of the MIP images confirms the above findings IMPRESSION: 1. Thrombus in the right carotid bulb extending superiorly along the proximal and mid cervical segment of the right ICA resulting in severe stenosis. 2. Luminal irregularity at the right distal M2/M3/MCA may represent subocclusive clot. These results were called by telephone at the time of interpretation on 12/29/2020 at 1:10 pm to [facility] , who verbally acknowledged these results. Electronically Signed   By: Baldemar LenisKatyucia  De Macedo Rodrigues M.D.   On: 12/29/2020 13:19   CT HEAD WO CONTRAST  Result Date: 12/29/2020 CLINICAL DATA:  Stroke. Neuro deficit, acute, stroke suspected. Post IR thrombectomy. EXAM: CT HEAD WITHOUT CONTRAST TECHNIQUE: Contiguous axial images were obtained from the base of the skull through the vertex without intravenous  contrast. COMPARISON:  Brain MRI 12/29/2020, CT angiogram head/neck 12/29/2020, noncontrast head CT 12/29/2020. FINDINGS: Brain: Redemonstrated patchy acute cortical and subcortical infarcts within the right frontal, parietal, occipital and temporal lobes within the right MCA territory and right MCA/ACA and MCA/PCA watershed territories. These infarcts do not appear significantly changed in extent as compared to the brain MRI performed earlier today and are again overall moderate in volume. Unchanged subtle petechial hemorrhage at site of an acute infarct within the right frontal lobe (series 3, image 20) and right occipital lobe (series 3, image 15).  No frank hemorrhagic conversion. No significant mass effect. Background mild ill-defined hypoattenuation within the cerebral white matter is nonspecific, but compatible with chronic small vessel ischemic disease. No extra-axial fluid collection. No evidence of intracranial mass. No midline shift. Vascular: Residual intravascular contrast limits evaluation for hyperdense vessels. Skull: Normal. Negative for fracture or focal lesion. Sinuses/Orbits: Visualized orbits show no acute finding. Mild bilateral ethmoid sinus mucosal thickening. IMPRESSION: Patchy acute cortical and subcortical infarcts, within the right cerebral hemisphere in the right MCA territory and right MCA/ACA and MCA/PCA watershed territories, not significantly changed in extent from the brain MRI performed earlier today. Unchanged subtle petechial hemorrhage at sites of acute infarct within the right frontal and occipital lobes. No significant mass effect. Background mild chronic small vessel ischemic disease. Mild ethmoid sinus mucosal thickening. Electronically Signed   By: Kellie Simmering DO   On: 12/29/2020 16:32   CT Code Stroke CTA Neck W/WO contrast  Result Date: 12/29/2020 CLINICAL DATA:  Left-sided weakness.  Stroke suspected. EXAM: CT ANGIOGRAPHY HEAD AND NECK TECHNIQUE: Multidetector CT  imaging of the head and neck was performed using the standard protocol during bolus administration of intravenous contrast. Multiplanar CT image reconstructions and MIPs were obtained to evaluate the vascular anatomy. Carotid stenosis measurements (when applicable) are obtained utilizing NASCET criteria, using the distal internal carotid diameter as the denominator. CONTRAST:  54mL OMNIPAQUE IOHEXOL 350 MG/ML SOLN COMPARISON:  Head CT December 29, 2020. FINDINGS: CTA NECK FINDINGS Aortic arch: Standard branching. Imaged portion shows no evidence of aneurysm or dissection. No significant stenosis of the major arch vessel origins. Right carotid system: A mural thrombus is seen in the right carotid bulb extending superiorly along the proximal and mid cervical segment of the right ICA resulting in severe stenosis. Left carotid system: Mild atherosclerotic changes. No evidence of dissection, stenosis (50% or greater) or occlusion. Vertebral arteries: Codominant. No evidence of dissection, stenosis (50% or greater) or occlusion. Skeleton: No acute findings. Other neck: Heterogeneous thyroid gland. Upper chest: Negative Review of the MIP images confirms the above findings CTA HEAD FINDINGS Anterior circulation: Intracranial right ICA is patent. Luminal irregularity at the right distal M2/M3/MCA may represent subocclusive clot. The bilateral ACA and the left MCA vascular trees are patent. Posterior circulation: No significant stenosis, proximal occlusion, aneurysm, or vascular malformation. Venous sinuses: As permitted by contrast timing, patent. Review of the MIP images confirms the above findings IMPRESSION: 1. Thrombus in the right carotid bulb extending superiorly along the proximal and mid cervical segment of the right ICA resulting in severe stenosis. 2. Luminal irregularity at the right distal M2/M3/MCA may represent subocclusive clot. These results were called by telephone at the time of interpretation on 12/29/2020  at 1:10 pm to provider [facility] , who verbally acknowledged these results. Electronically Signed   By: Pedro Earls M.D.   On: 12/29/2020 13:19   MR BRAIN WO CONTRAST  Result Date: 12/29/2020 CLINICAL DATA:  Neuro deficit, acute, stroke suspected. EXAM: MRI HEAD WITHOUT CONTRAST TECHNIQUE: Multiplanar, multiecho pulse sequences of the brain and surrounding structures were obtained without intravenous contrast. COMPARISON:  Noncontrast head CT and CT angiogram head/neck performed earlier today 12/29/2020. FINDINGS: Brain: Cerebral volume is normal for age. There are patchy cortical/subcortical infarcts within the right cerebral hemisphere within the right frontal, parietal, occipital and temporal lobes as well as right insula. These acute infarcts are present within the right MCA territory as well as MCA/PCA and MCA/ACA watershed territories. These infarcts overall moderate in  volume. No significant mass effect at this time. There is mild SWI signal loss and T1 hyperintensity associated with acute infarcts within the right frontal and occipital lobes consistent with petechial hemorrhage. Background mild multifocal T2/FLAIR hyperintensity within the cerebral white matter is nonspecific, but compatible with chronic small vessel ischemic disease. No evidence of intracranial mass. No extra-axial fluid collection. No midline shift. Vascular: Expected proximal arterial flow voids. Skull and upper cervical spine: No focal marrow lesion. Sinuses/Orbits: Visualized orbits show no acute finding. No significant paranasal sinus disease at the imaged levels. IMPRESSION: Patchy acute cortical/subcortical infarcts within the right cerebral hemisphere within the right MCA territory as well as MCA/PCA and MCA/ACA watershed territories. The infarcts are overall moderate in volume. No significant mass effect at this time. Petechial hemorrhage associated with an acute infarcts in the right frontal and right  occipital lobes. Background mild chronic small vessel ischemic disease. Electronically Signed   By: Jackey Loge DO   On: 12/29/2020 14:20   VAS [place] TRANSCRANIAL DOPPLER W BUBBLES  Result Date: 12/31/2020  Transcranial Doppler with Bubble Indications: Stroke. Comparison Study: No prior studies. Performing Technologist: Jean Rosenthal RDMS  Examination Guidelines: A complete evaluation includes B-mode imaging, spectral Doppler, color Doppler, and power Doppler as needed of all accessible portions of each vessel. Bilateral testing is considered an integral part of a complete examination. Limited examinations for reoccurring indications may be performed as noted.  Summary: No HITS at rest or during Valsalva. Negative transcranial Doppler Bubble study with no evidence of right to left intracardiac communication.  A vascular evaluation was performed. The right middle cerebral artery was studied. An IV was inserted into the patient's left forearm. Verbal informed consent was obtained.  *See table(s) above for TCD measurements and observations.  Diagnosing physician: Delia Heady MD Electronically signed by Delia Heady MD on 12/31/2020 at 11:11:33 AM.    Final    ECHOCARDIOGRAM COMPLETE  Result Date: 12/30/2020    ECHOCARDIOGRAM REPORT   Patient Name:   Antonio Rogers Date of Exam: 12/30/2020 Medical Rec #:  [id number]             Height:       73.0 in Accession #:    [id number]            Weight:       220.0 lb Date of Birth:  [birth year]             BSA:          2.241 m Patient Age:    34 years              BP:           120/76 mmHg Patient Gender: M                     HR:           74 bpm. Exam Location:  Inpatient Procedure: 2D Echo Indications:    Stroke I163.9  History:        Patient has no prior history of Echocardiogram examinations.                 Risk Factors:Hypertension.  Sonographer:    Thurman Coyer RDCS (AE) Referring Phys: [id number] SRISHTI L BHAGAT IMPRESSIONS  1. Left ventricular ejection  fraction, by estimation, is 55 to 60%. The left ventricle has normal function. The left ventricle has no regional wall motion abnormalities. There is mild left ventricular hypertrophy. Left  ventricular diastolic parameters are consistent with Grade I diastolic dysfunction (impaired relaxation).  2. Right ventricular systolic function is normal. The right ventricular size is normal.  3. The mitral valve is grossly normal. No evidence of mitral valve regurgitation.  4. The aortic valve is tricuspid. Aortic valve regurgitation is not visualized.  5. The inferior vena cava is normal in size with <50% respiratory variability, suggesting right atrial pressure of 8 mmHg. Comparison(s): No prior Echocardiogram. FINDINGS  Left Ventricle: Left ventricular ejection fraction, by estimation, is 55 to 60%. The left ventricle has normal function. The left ventricle has no regional wall motion abnormalities. The left ventricular internal cavity size was normal in size. There is  mild left ventricular hypertrophy. Left ventricular diastolic parameters are consistent with Grade I diastolic dysfunction (impaired relaxation). Indeterminate filling pressures. Right Ventricle: The right ventricular size is normal. No increase in right ventricular wall thickness. Right ventricular systolic function is normal. Left Atrium: Left atrial size was normal in size. Right Atrium: Right atrial size was normal in size. Pericardium: There is no evidence of pericardial effusion. Mitral Valve: The mitral valve is grossly normal. No evidence of mitral valve regurgitation. Tricuspid Valve: The tricuspid valve is grossly normal. Tricuspid valve regurgitation is trivial. Aortic Valve: The aortic valve is tricuspid. Aortic valve regurgitation is not visualized. Pulmonic Valve: The pulmonic valve was not well visualized. Pulmonic valve regurgitation is not visualized. Aorta: The aortic root and ascending aorta are structurally normal, with no evidence of  dilitation. Venous: The inferior vena cava is normal in size with less than 50% respiratory variability, suggesting right atrial pressure of 8 mmHg. IAS/Shunts: The interatrial septum was not well visualized.  LEFT VENTRICLE PLAX 2D LVIDd:         4.00 cm  Diastology LVIDs:         3.05 cm  LV e' medial:    6.22 cm/s LV PW:         1.10 cm  LV E/e' medial:  12.5 LV IVS:        1.20 cm  LV e' lateral:   5.61 cm/s LVOT diam:     2.40 cm  LV E/e' lateral: 13.9 LV SV:         77 LV SV Index:   35 LVOT Area:     4.52 cm  RIGHT VENTRICLE TAPSE (M-mode): 1.2 cm LEFT ATRIUM             Index       RIGHT ATRIUM           Index LA diam:        3.50 cm 1.56 cm/m  RA Area:     13.00 cm LA Vol (A2C):   42.2 ml 18.83 ml/m RA Volume:   23.90 ml  10.67 ml/m LA Vol (A4C):   45.2 ml 20.17 ml/m LA Biplane Vol: 45.9 ml 20.49 ml/m  AORTIC VALVE LVOT Vmax:   88.80 cm/s LVOT Vmean:  66.100 cm/s LVOT VTI:    0.171 m  AORTA Ao Root diam: 3.10 cm MITRAL VALVE MV Area (PHT): 2.95 cm    SHUNTS MV Decel Time: 257 msec    Systemic VTI:  0.17 m MV E velocity: 78.00 cm/s  Systemic Diam: 2.40 cm MV A velocity: 54.00 cm/s MV E/A ratio:  1.44 Lyman Bishop MD Electronically signed by Lyman Bishop MD Signature Date/Time: 12/30/2020/1:43:33 PM    Final    IR PERCUTANEOUS ART THROMBECTOMY/INFUSION INTRACRANIAL INC DIAG ANGIO  Result  Date: 01/02/2021 INDICATION: New onset left-sided weakness right gaze deviation. 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 EXAM: 1. EMERGENT LARGE VESSEL OCCLUSION THROMBOLYSIS (anterior CIRCULATION) COMPARISON:  CT angiogram of the head and neck of December 29, 2020. MEDICATIONS: Ancef 2 g IV antibiotic was administered within 1 hour of the procedure. ANESTHESIA/SEDATION: General anesthesia CONTRAST:  Isovue 300 approximately 80 mL FLUOROSCOPY TIME:  Fluoroscopy Time: 13 minutes 30 seconds (1251  mGy). COMPLICATIONS: None immediate. TECHNIQUE: Following a full explanation of the procedure along with the potential associated complications, an informed witnessed consent was obtained patient's father. The risks of intracranial hemorrhage of 10%, worsening neurological deficit, ventilator dependency, death and inability to revascularize were all reviewed in detail with the patient's father. The patient was then put under general anesthesia by the Department of Anesthesiology at [facility]. The right groin was prepped and draped in the usual sterile fashion. Thereafter using modified Seldinger technique, transfemoral access into the right common femoral artery was obtained without difficulty. Over a 0.035 inch guidewire an 8 [place] 25 cm Pinnacle sheath was inserted. Through this, and also over a 0.035 inch guidewire a combination of a 5.5 [place] Berenstein support catheter inside of an 087 balloon guide catheter was advanced to the aortic arch region and selectively positioned in the right common carotid artery proximal to the right common carotid bifurcation. The guidewire, and the support catheter were removed. Good aspiration obtained from the hub of the balloon guide catheter. An arteriogram was then performed centered extra cranially and intracranially. FINDINGS: The right common carotid arteriogram demonstrates the right external carotid artery and its major branches to be widely patent. The right internal carotid artery at the bulb demonstrates approximately 30% narrowing secondary to a smooth shallow atherosclerotic plaque. No evidence of ulcerations noted. There is slow ascent of contrast in the right internal carotid artery extra cranially to the petrous cervical junction. There is a long segment smooth filling defect hugging the entirety of the diameter of the right internal carotid artery to level of C1 the site of the severe stenosis. Distal to this the right internal carotid artery,  the cervical, the petrous and the cavernous segments appear widely patent. The supraclinoid right ICA is widely patent. Opacification is seen of the right middle cerebral artery distribution with no evidence of gross large vessel or medium vessel occlusions. Unopacified blood is seen in the middle cerebral artery from the contralateral left internal carotid artery via the anterior communicating artery. PROCEDURE: ENDOVASCULAR COMPLETE REVASCULARIZATION OF RIGHT INTERNAL CAROTID ARTERY Through the balloon guide catheter, a 136 cm 071 Zoom aspiration catheter was advanced over a 0.035 inch Roadrunner guidewire and positioned just distal to the balloon guide catheter proximal to the right internal carotid artery. The guidewire was removed. Good aspiration obtained from the hub of the aspiration catheter. The balloon guide catheter was inflated just proximal to the origin of the right internal carotid artery for proximal flow arrest. Over a 0.035 inch Roadrunner guidewire, the Zoom aspiration catheter was advanced into the proximal 1/3 of the right internal carotid artery. The guidewire was removed. Thereafter, as constant aspiration was applied at the hub of the Zoom aspiration catheter with aspiration device, the Zoom guidewire was advanced with minimal resistance to the petrous segment of the right internal carotid artery. Good aspiration obtained from the hub of the Zoom guide catheter distal to the site of the severe  stenosis. Through the Zoom aspiration catheter, an 021 Trevo ProVue microcatheter was advanced over a 0.014 inch standard Synchro micro guidewire with a J-tip configuration. The microcatheter was advanced to the petrous cavernous junction. Guidewire was removed. Good aspiration obtained from the hub of the microcatheter which was connected to continuous heparinized saline infusion. A 6 mm x 40 mm Solitaire X retrieval device was then advanced to the distal end of the microcatheter. This was then  deployed in the usual manner by retrieving the delivery microcatheter as far proximally into the Zoom aspiration catheter. Thereafter, aspiration was applied at the hub of the balloon guide catheter with a 20 mL syringe, and with a Penumbra aspiration device at the hub of the Zoom aspiration catheter as the combination of microcatheter and the Zoom aspiration catheter were retrieved and removed. Straw-colored membranous material was noted in the interstices of the retrieval device and also in the canister. Following reversal of flow arrest, a control arteriogram performed through the balloon guide demonstrated complete revascularization of the right internal carotid artery without evidence of any filling defects, spasm or stenosis. More distally brisk arterial flow was noted into the right middle cerebral artery and the right anterior cerebral artery distributions. A TICI 3 revascularization of the right middle cerebral artery and right anterior cerebral arteries was established. The balloon guide was then removed. A left common carotid arteriogram via a 5 French diagnostic catheter demonstrated the left external carotid artery and its major branches to be widely patent. The left internal carotid artery at the bulb to the cranial skull base demonstrated no evidence of stenosis, irregularities. The petrous, cavernous and the supraclinoid segments demonstrate wide patency. The left middle cerebral artery and the left anterior cerebral artery opacify into the capillary and venous phases. Selective left vertebral artery arteriogram demonstrates the origin of the left vertebral artery to be widely patent. The vessel opacifies to the cranial skull base with wide patency of the left vertebrobasilar junction, the left posterior-inferior cerebellar artery and the basilar artery, the posterior cerebral arteries, the superior cerebellar arteries and the anterior-inferior cerebellar arteries into the capillary and venous phases  on the lateral projection. An 8 French Angio-Seal closure device was used for hemostasis at the right groin puncture site. Distal pulses remained Dopplerable in all four extremities. The patient was left intubated and sent to CT scan for postprocedural CT scan from where the patient was sent to PACU. 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 No underlying intraluminal or endothelial irregularities were identified on the immediate post procedure arteriogram. PLAN: Follow-up CT angiogram of the head and neck in 3 months post discharge. Electronically Signed   By: Luanne Bras M.D.   On: 12/30/2020 11:26   CT HEAD CODE STROKE WO CONTRAST  Result Date: 12/29/2020 CLINICAL DATA:  Code stroke.  Left-sided weakness. EXAM: CT HEAD WITHOUT CONTRAST TECHNIQUE: Contiguous axial images were obtained from the base of the skull through the vertex without intravenous contrast. COMPARISON:  None. FINDINGS: Brain: No abnormality affects the brainstem or cerebellum. The left cerebral hemisphere is normal except for a questionable old white matter infarction in the left parietal region. On the right, there is evidence  of acute infarction within the middle cerebral artery territory. Cortical and subcortical low-density seen at the margins of the MCA distribution, with diminished gray-white differentiation elsewhere within the MCA territory. Minimal petechial blood products in the area the right frontal portion of the infarction. No mass effect. No hydrocephalus. Vascular: No hyperdense vessel. Skull: Normal Sinuses/Orbits: Clear/normal Other: None ASPECTS ([place] Stroke  Program Early CT Score) - Ganglionic level infarction (caudate, lentiform nuclei, internal capsule, insula, M1-M3 cortex): 3 - Supraganglionic infarction (M4-M6 cortex): 0 Total score (0-10 with 10 being normal): 3 IMPRESSION: Acute infarction in the right middle cerebral artery territory. Cortical and subcortical low-density at the margins of the infarction, with diminished gray-white differentiation elsewhere within the MCA territory. Minimal petechial blood products in the area of the right frontal portion of the infarction. No mass effect. Aspects score 3. These results were communicated to Dr. Curly [place] At 12:37 pmon 1/27/2022by text page via the [facility] messaging system. Electronically Signed   By: Nelson Chimes M.D.   On: 12/29/2020 12:38   VAS [place] LOWER EXTREMITY VENOUS (DVT)  Result Date: 12/30/2020  Lower Venous DVT Study Indications: Stroke.  Limitations: Recent RT femoral catheterization- limited visibility of RT CFV. Comparison Study: No prior studies. Performing Technologist: Darlin Coco RDMS  Examination Guidelines: A complete evaluation includes B-mode imaging, spectral Doppler, color Doppler, and power Doppler as needed of all accessible portions of each vessel. Bilateral testing is considered an integral part of a complete examination. Limited examinations for reoccurring indications may be performed as noted. The reflux portion of the exam is performed with the patient in reverse Trendelenburg.  +---------+---------------+---------+-----------+----------+--------------+ RIGHT    CompressibilityPhasicitySpontaneityPropertiesThrombus Aging +---------+---------------+---------+-----------+----------+--------------+ CFV                     Yes      Yes                                 +---------+---------------+---------+-----------+----------+--------------+ FV Prox  Full                                                         +---------+---------------+---------+-----------+----------+--------------+ FV Mid   Full                                                        +---------+---------------+---------+-----------+----------+--------------+ FV DistalFull                                                        +---------+---------------+---------+-----------+----------+--------------+ PFV      Full                                                        +---------+---------------+---------+-----------+----------+--------------+ POP      Full  Yes      Yes                                 +---------+---------------+---------+-----------+----------+--------------+ PTV      Full                                                        +---------+---------------+---------+-----------+----------+--------------+ PERO     Full                                                        +---------+---------------+---------+-----------+----------+--------------+   +---------+---------------+---------+-----------+----------+--------------+ LEFT     CompressibilityPhasicitySpontaneityPropertiesThrombus Aging +---------+---------------+---------+-----------+----------+--------------+ CFV      Full           Yes      Yes                                 +---------+---------------+---------+-----------+----------+--------------+ SFJ      Full                                                        +---------+---------------+---------+-----------+----------+--------------+ FV Prox  Full                                                        +---------+---------------+---------+-----------+----------+--------------+ FV Mid   Full                                                        +---------+---------------+---------+-----------+----------+--------------+ FV DistalFull                                                         +---------+---------------+---------+-----------+----------+--------------+ PFV      Full                                                        +---------+---------------+---------+-----------+----------+--------------+ POP      Full           Yes      Yes                                 +---------+---------------+---------+-----------+----------+--------------+ PTV  Full                                                        +---------+---------------+---------+-----------+----------+--------------+ PERO     Full                                                        +---------+---------------+---------+-----------+----------+--------------+     Summary: RIGHT: - There is no evidence of deep vein thrombosis in the lower extremity. However, portions of this examination were limited- see technologist comments above.  - No cystic structure found in the popliteal fossa.  LEFT: - There is no evidence of deep vein thrombosis in the lower extremity.  - No cystic structure found in the popliteal fossa.  *See table(s) above for measurements and observations. Electronically signed by Heath Lark on 12/30/2020 at 5:20:23 PM.    Final    IR ANGIO INTRA EXTRACRAN SEL COM CAROTID INNOMINATE UNI L MOD SED  Result Date: 01/02/2021 INDICATION: New onset left-sided weakness right gaze deviation. 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 EXAM: 1. EMERGENT LARGE VESSEL OCCLUSION THROMBOLYSIS (anterior CIRCULATION) COMPARISON:  CT angiogram of the head and neck of December 29, 2020. MEDICATIONS: Ancef 2 g IV antibiotic was administered within 1 hour of the procedure. ANESTHESIA/SEDATION: General anesthesia CONTRAST:  Isovue 300 approximately 80 mL FLUOROSCOPY TIME:  Fluoroscopy Time: 13 minutes 30 seconds (1251 mGy). COMPLICATIONS: None immediate. TECHNIQUE: Following a full  explanation of the procedure along with the potential associated complications, an informed witnessed consent was obtained patient's father. The risks of intracranial hemorrhage of 10%, worsening neurological deficit, ventilator dependency, death and inability to revascularize were all reviewed in detail with the patient's father. The patient was then put under general anesthesia by the Department of Anesthesiology at [facility]. The right groin was prepped and draped in the usual sterile fashion. Thereafter using modified Seldinger technique, transfemoral access into the right common femoral artery was obtained without difficulty. Over a 0.035 inch guidewire an 8 [place] 25 cm Pinnacle sheath was inserted. Through this, and also over a 0.035 inch guidewire a combination of a 5.5 [place] Berenstein support catheter inside of an 087 balloon guide catheter was advanced to the aortic arch region and selectively positioned in the right common carotid artery proximal to the right common carotid bifurcation. The guidewire, and the support catheter were removed. Good aspiration obtained from the hub of the balloon guide catheter. An arteriogram was then performed centered extra cranially and intracranially. FINDINGS: The right common carotid arteriogram demonstrates the right external carotid artery and its major branches to be widely patent. The right internal carotid artery at the bulb demonstrates approximately 30% narrowing secondary to a smooth shallow atherosclerotic plaque. No evidence of ulcerations noted. There is slow ascent of contrast in the right internal carotid artery extra cranially to the petrous cervical junction. There is a long segment smooth filling defect hugging the entirety of the diameter of the right internal carotid artery to level of C1 the site of the severe stenosis. Distal to this the right  internal carotid artery, the cervical, the petrous and the cavernous segments appear  widely patent. The supraclinoid right ICA is widely patent. Opacification is seen of the right middle cerebral artery distribution with no evidence of gross large vessel or medium vessel occlusions. Unopacified blood is seen in the middle cerebral artery from the contralateral left internal carotid artery via the anterior communicating artery. PROCEDURE: ENDOVASCULAR COMPLETE REVASCULARIZATION OF RIGHT INTERNAL CAROTID ARTERY Through the balloon guide catheter, a 136 cm 071 Zoom aspiration catheter was advanced over a 0.035 inch Roadrunner guidewire and positioned just distal to the balloon guide catheter proximal to the right internal carotid artery. The guidewire was removed. Good aspiration obtained from the hub of the aspiration catheter. The balloon guide catheter was inflated just proximal to the origin of the right internal carotid artery for proximal flow arrest. Over a 0.035 inch Roadrunner guidewire, the Zoom aspiration catheter was advanced into the proximal 1/3 of the right internal carotid artery. The guidewire was removed. Thereafter, as constant aspiration was applied at the hub of the Zoom aspiration catheter with aspiration device, the Zoom guidewire was advanced with minimal resistance to the petrous segment of the right internal carotid artery. Good aspiration obtained from the hub of the Zoom guide catheter distal to the site of the severe stenosis. Through the Zoom aspiration catheter, an 021 Trevo ProVue microcatheter was advanced over a 0.014 inch standard Synchro micro guidewire with a J-tip configuration. The microcatheter was advanced to the petrous cavernous junction. Guidewire was removed. Good aspiration obtained from the hub of the microcatheter which was connected to continuous heparinized saline infusion. A 6 mm x 40 mm Solitaire X retrieval device was then advanced to the distal end of the microcatheter. This was then deployed in the usual manner by retrieving the delivery  microcatheter as far proximally into the Zoom aspiration catheter. Thereafter, aspiration was applied at the hub of the balloon guide catheter with a 20 mL syringe, and with a Penumbra aspiration device at the hub of the Zoom aspiration catheter as the combination of microcatheter and the Zoom aspiration catheter were retrieved and removed. Straw-colored membranous material was noted in the interstices of the retrieval device and also in the canister. Following reversal of flow arrest, a control arteriogram performed through the balloon guide demonstrated complete revascularization of the right internal carotid artery without evidence of any filling defects, spasm or stenosis. More distally brisk arterial flow was noted into the right middle cerebral artery and the right anterior cerebral artery distributions. A TICI 3 revascularization of the right middle cerebral artery and right anterior cerebral arteries was established. The balloon guide was then removed. A left common carotid arteriogram via a 5 French diagnostic catheter demonstrated the left external carotid artery and its major branches to be widely patent. The left internal carotid artery at the bulb to the cranial skull base demonstrated no evidence of stenosis, irregularities. The petrous, cavernous and the supraclinoid segments demonstrate wide patency. The left middle cerebral artery and the left anterior cerebral artery opacify into the capillary and venous phases. Selective left vertebral artery arteriogram demonstrates the origin of the left vertebral artery to be widely patent. The vessel opacifies to the cranial skull base with wide patency of the left vertebrobasilar junction, the left posterior-inferior cerebellar artery and the basilar artery, the posterior cerebral arteries, the superior cerebellar arteries and the anterior-inferior cerebellar arteries into the capillary and venous phases on the lateral projection. An 8 French Angio-Seal  closure device was  used for hemostasis at the right groin puncture site. Distal pulses remained Dopplerable in all four extremities. The patient was left intubated and sent to CT scan for postprocedural CT scan from where the patient was sent to PACU. 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 No underlying intraluminal or endothelial irregularities were identified on the immediate post procedure arteriogram. PLAN: Follow-up CT angiogram of the head and neck in 3 months post discharge. Electronically Signed   By: Luanne Bras M.D.   On: 12/30/2020 11:26   IR ANGIO VERTEBRAL SEL VERTEBRAL UNI L MOD SED  Result Date: 01/02/2021 INDICATION: New onset left-sided weakness right gaze deviation. 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 EXAM: 1. EMERGENT LARGE VESSEL OCCLUSION THROMBOLYSIS (anterior CIRCULATION) COMPARISON:  CT angiogram of the head and neck of December 29, 2020. MEDICATIONS: Ancef 2 g IV antibiotic was administered within 1 hour of the procedure. ANESTHESIA/SEDATION: General anesthesia CONTRAST:  Isovue 300 approximately 80 mL FLUOROSCOPY TIME:  Fluoroscopy Time: 13 minutes 30 seconds (1251 mGy). COMPLICATIONS: None immediate. TECHNIQUE: Following a full explanation of the procedure along with the potential associated complications, an informed witnessed consent was obtained patient's father. The risks of intracranial hemorrhage of 10%, worsening neurological deficit, ventilator dependency, death and inability to revascularize  were all reviewed in detail with the patient's father. The patient was then put under general anesthesia by the Department of Anesthesiology at [facility]. The right groin was prepped and draped in the usual sterile fashion. Thereafter using modified Seldinger technique, transfemoral access into the right common femoral artery was obtained without difficulty. Over a 0.035 inch guidewire an 8 [place] 25 cm Pinnacle sheath was inserted. Through this, and also over a 0.035 inch guidewire a combination of a 5.5 [place] Berenstein support catheter inside of an 087 balloon guide catheter was advanced to the aortic arch region and selectively positioned in the right common carotid artery proximal to the right common carotid bifurcation. The guidewire, and the support catheter were removed. Good aspiration obtained from the hub of the balloon guide catheter. An arteriogram was then performed centered extra cranially and intracranially. FINDINGS: The right common carotid arteriogram demonstrates the right external carotid artery and its major branches to be widely patent. The right internal carotid artery at the bulb demonstrates approximately 30% narrowing secondary to a smooth shallow atherosclerotic plaque. No evidence of ulcerations noted. There is slow ascent of contrast in the right internal carotid artery extra cranially to the petrous cervical junction. There is a long segment smooth filling defect hugging the entirety of the diameter of the right internal carotid artery to level of C1 the site of the severe stenosis. Distal to this the right internal carotid artery, the cervical, the petrous and the cavernous segments appear widely patent. The supraclinoid right ICA is widely patent. Opacification is seen of the right middle cerebral artery distribution with no evidence of gross large vessel or medium vessel occlusions. Unopacified blood is seen in the middle cerebral artery from the contralateral left  internal carotid artery via the anterior communicating artery. PROCEDURE: ENDOVASCULAR COMPLETE  REVASCULARIZATION OF RIGHT INTERNAL CAROTID ARTERY Through the balloon guide catheter, a 136 cm 071 Zoom aspiration catheter was advanced over a 0.035 inch Roadrunner guidewire and positioned just distal to the balloon guide catheter proximal to the right internal carotid artery. The guidewire was removed. Good aspiration obtained from the hub of the aspiration catheter. The balloon guide catheter was inflated just proximal to the origin of the right internal carotid artery for proximal flow arrest. Over a 0.035 inch Roadrunner guidewire, the Zoom aspiration catheter was advanced into the proximal 1/3 of the right internal carotid artery. The guidewire was removed. Thereafter, as constant aspiration was applied at the hub of the Zoom aspiration catheter with aspiration device, the Zoom guidewire was advanced with minimal resistance to the petrous segment of the right internal carotid artery. Good aspiration obtained from the hub of the Zoom guide catheter distal to the site of the severe stenosis. Through the Zoom aspiration catheter, an 021 Trevo ProVue microcatheter was advanced over a 0.014 inch standard Synchro micro guidewire with a J-tip configuration. The microcatheter was advanced to the petrous cavernous junction. Guidewire was removed. Good aspiration obtained from the hub of the microcatheter which was connected to continuous heparinized saline infusion. A 6 mm x 40 mm Solitaire X retrieval device was then advanced to the distal end of the microcatheter. This was then deployed in the usual manner by retrieving the delivery microcatheter as far proximally into the Zoom aspiration catheter. Thereafter, aspiration was applied at the hub of the balloon guide catheter with a 20 mL syringe, and with a Penumbra aspiration device at the hub of the Zoom aspiration catheter as the combination of microcatheter and the  Zoom aspiration catheter were retrieved and removed. Straw-colored membranous material was noted in the interstices of the retrieval device and also in the canister. Following reversal of flow arrest, a control arteriogram performed through the balloon guide demonstrated complete revascularization of the right internal carotid artery without evidence of any filling defects, spasm or stenosis. More distally brisk arterial flow was noted into the right middle cerebral artery and the right anterior cerebral artery distributions. A TICI 3 revascularization of the right middle cerebral artery and right anterior cerebral arteries was established. The balloon guide was then removed. A left common carotid arteriogram via a 5 French diagnostic catheter demonstrated the left external carotid artery and its major branches to be widely patent. The left internal carotid artery at the bulb to the cranial skull base demonstrated no evidence of stenosis, irregularities. The petrous, cavernous and the supraclinoid segments demonstrate wide patency. The left middle cerebral artery and the left anterior cerebral artery opacify into the capillary and venous phases. Selective left vertebral artery arteriogram demonstrates the origin of the left vertebral artery to be widely patent. The vessel opacifies to the cranial skull base with wide patency of the left vertebrobasilar junction, the left posterior-inferior cerebellar artery and the basilar artery, the posterior cerebral arteries, the superior cerebellar arteries and the anterior-inferior cerebellar arteries into the capillary and venous phases on the lateral projection. An 8 French Angio-Seal closure device was used for hemostasis at the right groin puncture site. Distal pulses remained Dopplerable in all four extremities. The patient was left intubated and sent to CT scan for postprocedural CT scan from where the patient was sent to PACU. 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 No underlying intraluminal or endothelial irregularities were identified on the immediate post procedure arteriogram. PLAN: Follow-up CT angiogram of the head and neck in 3 months post discharge. Electronically Signed   By: Luanne Bras M.D.   On: 12/30/2020 11:26    PHYSICAL EXAM   Temp:  [98 F (36.7 C)-100.3 F (37.9 C)] 98 F (36.7 C) (01/31 0800) Pulse Rate:  [80-104] 88 (01/31 0710) Resp:  [11-34] 23 (01/31 0710) BP: (125-145)/(80-99) 137/94 (01/31 0710) SpO2:  [86 %-100 %] 99 % (01/31 0710)  General - Well nourished, well developed, in no apparent distress.  Ophthalmologic - fundi not visualized due to noncooperation.  Cardiovascular - Regular rhythm and rate.  Mental Status -  Level of arousal and orientation to time, place, and person were intact. Language including expression, naming, repetition, comprehension was assessed and found intact.  Cranial Nerves II - XII - II - Visual field intact OU. III, IV, VI - Extraocular movements intact, mild right gaze preference. V - Facial sensation intact bilaterally. VII - mild left facial weakness. VIII - Hearing & vestibular intact bilaterally. X - Palate elevates symmetrically. XI - Chin turning & shoulder shrug intact bilaterally. XII - Tongue protrusion intact.  Motor Strength - The patient's strength was normal in right upper and lower extremities, however, left UE proximal 4/5, distal wrist extension 2/5 and finger grip 2/5. Left LE 4+/5 proximal and 4-/5 ankle PF/DF.  Bulk was normal and fasciculations were absent.   Motor Tone - Muscle tone was assessed at the  neck and appendages and was normal.  Reflexes - The patient's reflexes were symmetrical in all extremities and he had no pathological reflexes.  Sensory -reduced to pinprick, light touch and pain on the left side.  Coordination - The patient had normal movements in the hands with no ataxia or dysmetria.  Tremor was absent.  Gait and Station - deferred.   ASSESSMENT/PLAN [name] is a 35 y.o. male with history of remote cocaine use, HTN, anxiety and depression admitted for found down with right gaze and left weakness. No tPA given due to unclear time onset. S/p IR for right ICA thrombus.     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  Resultant left facial droop and left arm weakness  CT head right MCA patchy subacute infarcts  CTA head and neck - Thrombus in the right carotid bulb extending superiorly along the proximal and mid cervical segment of the right ICA resulting in severe stenosis. Luminal irregularity at the right distal M2/M3/MCA may represent  subocclusive clot.  MRI right MCA scattered patchy infarcts with petechial hemorrhage  CT repeat - Patchy acute cortical and subcortical infarcts, within the right cerebral hemisphere in the right MCA territory and right MCA/ACA and MCA/PCA watershed territories  2D Echo EF 55-60%  TCD bubble study no PFO  LE venous doppler - no DVT  LDL 51  HgbA1c 5.5  Hypercoagulable work up pending  lovenox for VTE prophylaxis  No antithrombotic prior to admission, now on aspirin 325 mg daily. DAPT on hold due to petechial hemorrhage.Plan to start plavix tomorrow.   Ongoing aggressive stroke risk  factor management  Therapy recommendations:  CIR  Disposition:  pending  QT prolongation  Likely acquired from his home medication including hydroxyzine, trazodone and  effexor  QTc ranging from 574->634  In 2013 only 416  Hold off home meds for now  Anxiety and depression  Lately getting worse for the last 2 weeks  PCP increased effexor from 100->150  Also on home hydroxyzine and trazodone  Currently all home meds on hold due to QTc prolongation  Pt not able to have buspar given prior side effects  Add clonazepam 0.5mg  bid  Hypertension . Stable . BP goal < 160 given petechial hemorrhage  Long term BP goal normotensive  Hyperlipidemia  Home meds:  none   LDL 51, goal < 70  Now on lipitor 20 - no high intensity given LDL at goal  Continue statin at discharge  Tobacco abuse  Current smoker  Smoking cessation counseling provided  Nicotine patch provided  Pt is willing to quit  Other Stroke Risk Factors  Brother died at age of 44 due to enlarged heart with ? clot  Remote cocaine use - sober since age 63 (UDS - negative)  Other Active Problems  Leukocytosis  WBC 5.5->7.4->13.3->16.5  Low grade fever tmax 100.3  No coughing, congestion  No active infectious complaints  UA negative 1/29  Will monitor, obtain fever work  if becomes febrile >101 and/or develops symptoms of concern    Hypokalemia - WNL today    supplemented - recheck in AM  Hyponatremia Na 128->125->130->132->131  Mild thrombocytopenia - platelet 128->163 - resolved  Critical Care signed off 12/31/20  UA on 1/29 - not c/w UTI  Cortisol level - Normal at 20.6  Greatly elevated homocystine level.  Folic acid started and vitamin B12 obtained.  Hospital day # 4 I have personally obtained history,examined this patient, reviewed notes, independently viewed imaging studies, participated in medical decision making and plan of care.ROS completed by me personally and pertinent positives fully documented  I have made any additions or clarifications directly to the above note. Agree with note above.  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  He has no prior history of suggest hypercoagulable state.  Continue mobilization out of bed.  Ongoing therapies.  Recommend check TEE to look for cardiac source of embolism tomorrow.  Continue aspirin for now and Plavix tomorrow This patient is critically ill due to stroke post procedure and at significant risk of neurological worsening, death form severe anemia, bleeding, recurrent stroke, intracranial stenosis. This patient's care requires constant monitoring of vital signs, hemodynamics, respiratory and cardiac monitoring, review of multiple databases, neurological assessment, other specialists and medical decision making of high complexity. I spent 35 minutes of neurocritical care time in the care of this patient.   Antony Contras, MD     To contact Stroke Continuity provider, please refer to [email or web address] After hours, contact General Neurology

## 2021-01-02 NOTE — Consult Note (Signed)
Physical Medicine and Rehabilitation Consult Reason for Consult: Left side weakness Referring Physician: Dr.Xu  HPI: Antonio Rogers is a 35 y.o. right-handed male with history of anxiety, remote cocaine use, hypertension, tobacco abuse.  Per chart review patient lives alone.  Independent prior to admission.  Multilevel home.  Patient is currently unemployed.  Reportedly has assistance from his parents if needed.Presented 12/29/2020 with  left-sided weakness x2 days.  Admission chemistries alcohol negative, sodium 128, potassium 2.8, BUN 28, creatinine 1.11, CK 43.  Cranial CT scan showed acute infarction in the right middle cerebral artery territory.  Cortical and subcortical low-density at the margins of the infarction.  No mass-effect.  Patient did not receive TPA.  CTA of head and neck showed thrombus in the right carotid bulb extending superiorly along the proximal and mid cervical segment of the right ICA resulting in severe stenosis.  Luminal irregularity at the right distal M2/M3/MCA possibly representing subocclusive clot.  MRI of the brain showed patchy acute cortical subcortical infarct in the right cerebral hemisphere with the right MCA territory as well as MCA/PCA and MCA/ACA watershed territories.  Petechial hemorrhage associated with acute infarct in the right frontal and right occipital lobes.  Patient underwent revascularization right ICA thrombus per interventional radiology.  Echocardiogram with ejection fraction of 55 to 18% grade 1 diastolic dysfunction no wall motion abnormalities.  Patient is currently maintained on aspirin 325 mg daily for CVA prophylaxis.  Subcutaneous Lovenox for DVT prophylaxis.  Tolerating a regular consistency diet. Physical Medicine & Rehabilitation was consulted to assess candidacy for CIR given left sided weakness.    Review of Systems  Constitutional: Negative for fever.  HENT: Negative for hearing loss.   Eyes: Negative for blurred vision  and double vision.  Respiratory: Negative for cough and shortness of breath.   Cardiovascular: Negative for chest pain, palpitations and leg swelling.  Gastrointestinal: Positive for constipation. Negative for heartburn, nausea and vomiting.  Genitourinary: Negative for dysuria, flank pain and hematuria.  Musculoskeletal: Positive for myalgias.  Skin: Negative for rash.  Neurological: Positive for weakness.  Psychiatric/Behavioral: Positive for depression.       Anxiety  All other systems reviewed and are negative.  Past Medical History:  Diagnosis Date  . Anxiety   . Chicken pox   . Depression   . Drug abuse and dependence ([place])    cocaine   . Hypertension    high blood pressure readings   Past Surgical History:  Procedure Laterality Date  . RADIOLOGY WITH ANESTHESIA N/A 12/29/2020   Procedure: IR WITH ANESTHESIA;  Surgeon: Radiologist, Medication, MD;  Location: [facility];  Service: Radiology;  Laterality: N/A;  . TONSILLECTOMY  2009   Family History  Problem Relation Age of Onset  . Hypercholesterolemia Father   . High blood pressure Father   . Drug abuse Paternal Uncle   . Drug abuse Maternal Uncle   . Schizophrenia Maternal Grandfather   . Sudden Cardiac Death Brother        cocaine use  . Thrombosis Brother    Social History:  reports that he has been smoking cigarettes. He has been smoking about 1.00 pack per day. He does not have any smokeless tobacco history on file. He reports current alcohol use. No history on file for drug use. Allergies:  Allergies  Allergen Reactions  . Penicillins Anaphylaxis   Medications Prior to Admission  Medication Sig Dispense Refill  . hydrOXYzine (ATARAX/VISTARIL) 50 MG tablet Take 100 mg  by mouth daily.  2  . traZODone (DESYREL) 100 MG tablet Take 150 mg by mouth at bedtime.    Marland Kitchen venlafaxine XR (EFFEXOR-XR) 75 MG 24 hr capsule Take 150 mg by mouth daily.    Marland Kitchen lisinopril (PRINIVIL,ZESTRIL) 20 MG tablet TAKE 1 TABLET (20 MG TOTAL) BY  MOUTH DAILY. (Patient not taking: No sig reported) 90 tablet 1    Home: [place] expects to be discharged to:: Private residence Living Arrangements: Alone Available Help at Discharge: Family,Available 24 hours/day Type of Home: House Home Access: Level entry Home Layout: Multi-level (split level 4  steps down den, 5 step to bedroom-- 9 total from lowest level to top level) Alternate Level Stairs-Rails: Right,Left (opposite sides for the different stairs) Bathroom Shower/Tub: Multimedia programmer: Standard Bathroom Accessibility: Yes Home Equipment: None Additional Comments: pitbull Lucy- able to let the dog out without walking  Lives With: Other (Comment) (with dog)  Functional History: Prior Function Level of Independence: Independent Comments: not working Functional Status:  Mobility: Bed Mobility Overal bed mobility: Needs Assistance Bed Mobility: Supine to Sit Rolling: Min guard Supine to sit: Supervision,HOB elevated General bed mobility comments: pt attempts to laterally scoot hips toward edge of bed initially, requires cues to attend to edge of bed. Then sits into long sitting and pivots hips toward edge. Pt requires physical assistance and cues to maintain safety. Transfers Overall transfer level: Needs assistance Equipment used: None Transfers: Sit to/from Stand Sit to Stand: Min guard General transfer comment: pt requires cues to guard L wrist due to aline placement Ambulation/Gait Ambulation/Gait assistance: Min guard Gait Distance (Feet): 150 Feet Assistive device: None Gait Pattern/deviations: Step-through pattern General Gait Details: pt with slowed step-through gait. Pt is unable to significantly change gait speed upon PT request. Pt able to perform head turns to right but only minimally to left side. Gait velocity: reduced Gait velocity interpretation: <1.8 ft/sec, indicate of risk for recurrent falls    ADL: ADL Overall  ADL's : Needs assistance/impaired Eating/Feeding: Sitting,Moderate assistance Grooming: Wash/dry hands,Wash/dry face,Moderate assistance,Sitting Lower Body Dressing: Moderate assistance,Sit to/from stand Lower Body Dressing Details (indicate cue type and reason): unable to use L hand to hold sock. pt able to figure 4 cross and with Min ()A for balance at EOB and thread R over toes to have pt pull up. pt able to hook and don L with sitting in bed criss cross apple sauce Toilet Transfer: +2 for physical assistance,Minimal assistance Functional mobility during ADLs: +2 for physical assistance,Minimal assistance General ADL Comments: pt needs cues for L visual field. pt rolling nearly to off eob and lack awareness  Cognition: Cognition Overall Cognitive Status: Impaired/Different from baseline Arousal/Alertness: Awake/alert Orientation Level: Oriented X4 Attention: Focused,Sustained Focused Attention: Impaired Focused Attention Impairment: Verbal complex Sustained Attention: Impaired Sustained Attention Impairment: Verbal complex Memory: Impaired Memory Impairment: Retrieval deficit (Immediate & delayed: 5/5; paragraph: 6/8) Awareness: Impaired Awareness Impairment: Emergent impairment Problem Solving: Impaired Problem Solving Impairment: Verbal complex Executive Function: Sequencing,Reasoning,Organizing Reasoning: Appears intact Sequencing: Impaired Sequencing Impairment: Verbal complex (Clock drawing: 0/4) Organizing: Impaired Organizing Impairment: Verbal complex (backward digit span: 1/2) Cognition Arousal/Alertness: Awake/alert Behavior During Therapy: WFL for tasks assessed/performed Overall Cognitive Status: Impaired/Different from baseline Area of Impairment: Safety/judgement Safety/Judgement: Decreased awareness of deficits (L inattention)  Blood pressure 133/83, pulse 98, temperature 98.3 F (36.8 C), temperature source Oral, resp. rate 15, height 6\' 1"  (1.854 m), weight  99.8 kg, SpO2 (!) 86 %. Physical Exam Gen: no distress, normal appearing  HEENT: mild right facial droop, mild right gaze preference Cardio: Reg rate Chest: normal effort, normal rate of breathing Abd: soft, non-distended Ext: no edema Psych: pleasant, normal affect Skin: intact Neuro: Patient is alert.  He exhibits a left inattention as well as decrease in awareness.  He was able to provide his name and age.  Follows simple commands. LUE: 4/5 SA, EE, EF, 2/5 WE and hand grip. LLE 4/5 HF, KE, and 3/5 PF/DF   Results for orders placed or performed during the hospital encounter of 12/29/20 (from the past 24 hour(s))  Comprehensive metabolic panel     Status: Abnormal   Collection Time: 01/01/21  6:42 AM  Result Value Ref Range   Sodium 131 (L) 135 - 145 mmol/L   Potassium 3.7 3.5 - 5.1 mmol/L   Chloride 97 (L) 98 - 111 mmol/L   CO2 22 22 - 32 mmol/L   Glucose, Bld 135 (H) 70 - 99 mg/dL   BUN 11 6 - 20 mg/dL   Creatinine, Ser 0.82 0.61 - 1.24 mg/dL   Calcium 8.3 (L) 8.9 - 10.3 mg/dL   Total Protein 5.5 (L) 6.5 - 8.1 g/dL   Albumin 2.6 (L) 3.5 - 5.0 g/dL   AST 41 15 - 41 U/L   ALT 21 0 - 44 U/L   Alkaline Phosphatase 87 38 - 126 U/L   Total Bilirubin 1.0 0.3 - 1.2 mg/dL   GFR, Estimated >60 >60 mL/min   Anion gap 12 5 - 15  CBC     Status: Abnormal   Collection Time: 01/01/21  6:42 AM  Result Value Ref Range   WBC 13.3 (H) 4.0 - 10.5 K/uL   RBC 3.79 (L) 4.22 - 5.81 MIL/uL   Hemoglobin 12.8 (L) 13.0 - 17.0 g/dL   HCT 36.6 (L) 39.0 - 52.0 %   MCV 96.6 80.0 - 100.0 fL   MCH 33.8 26.0 - 34.0 pg   MCHC 35.0 30.0 - 36.0 g/dL   RDW 18.1 (H) 11.5 - 15.5 %   Platelets 235 150 - 400 K/uL   nRBC 0.0 0.0 - 0.2 %   No results found.   Assessment/Plan: Diagnosis: Right MCA infarct 1. Does the need for close, 24 hr/day medical supervision in concert with the patient's rehab needs make it unreasonable for this patient to be served in a less intensive setting? Yes 2. Co-Morbidities  requiring supervision/potential complications:  1. Remote cocaine use 2. HTN: will require regular monitoring of BP 3. Anxiety 4. Depression 5. Right gaze preference: will require intensive PT and OT 6. Left sided weakness: will require intensive PT and OT 3. Due to bladder management, bowel management, safety, skin/wound care, disease management, medication administration, pain management and patient education, does the patient require 24 hr/day rehab nursing? Yes 4. Does the patient require coordinated care of a physician, rehab nurse, therapy disciplines of PT, OT to address physical and functional deficits in the context of the above medical diagnosis(es)? Yes Addressing deficits in the following areas: balance, endurance, locomotion, strength, transferring, bowel/bladder control, bathing, dressing, feeding, grooming, toileting and psychosocial support 5. Can the patient actively participate in an intensive therapy program of at least 3 hrs of therapy per day at least 5 days per week? Yes 6. The potential for patient to make measurable gains while on inpatient rehab is excellent 7. Anticipated functional outcomes upon discharge from inpatient rehab are modified independent  with PT, modified independent with OT, modified independent with SLP. 8. Estimated rehab  length of stay to reach the above functional goals is: 5-7 days 9. Anticipated discharge destination: Home 10. Overall Rehab/Functional Prognosis: excellent  RECOMMENDATIONS: This patient's condition is appropriate for continued rehabilitative care in the following setting: CIR Patient has agreed to participate in recommended program. Yes Note that insurance prior authorization may be required for reimbursement for recommended care.  Comment: Thank you for this consult. Admission coordinator to follow.   I have personally performed a face to face diagnostic evaluation, including, but not limited to relevant history and physical  exam findings, of this patient and developed relevant assessment and plan.  Additionally, I have reviewed and concur with the physician assistant's documentation above.  Leeroy Cha, MD  Lavon Paganini [place], PA-C 01/02/2021

## 2021-01-02 NOTE — Progress Notes (Signed)
Inpatient Rehab Admissions Coordinator:   I met with pt. At bedside for ongoing discussion regarding inpatient rehab. Pt. Remains interested in CIR. I am working on [facility] and do not yet have auth or a bed to offer on CIR today. Will follow for potential admit later this week, pending insurance auth and bed availability.   Clemens Catholic, [place], [place] Admissions Coordinator  [phone] ([place]) [phone] (office)

## 2021-01-02 NOTE — Anesthesia Preprocedure Evaluation (Signed)
Anesthesia Evaluation  Patient identified by MRN, date of birth, ID band Patient awake    Reviewed: Allergy & Precautions, NPO status , Patient's Chart, lab work & pertinent test results  History of Anesthesia Complications Negative for: history of anesthetic complications  Airway Mallampati: II  TM Distance: >3 FB Neck ROM: Full    Dental  (+) Missing,    Pulmonary Current Smoker,    Pulmonary exam normal        Cardiovascular hypertension, Pt. on medications Normal cardiovascular exam  RCA thrombus   Neuro/Psych Anxiety Depression CVA    GI/Hepatic negative GI ROS, Neg liver ROS,   Endo/Other  negative endocrine ROS  Renal/GU negative Renal ROS  negative genitourinary   Musculoskeletal negative musculoskeletal ROS (+)   Abdominal   Peds  Hematology negative hematology ROS (+)   Anesthesia Other Findings Day of surgery medications reviewed with patient.  Reproductive/Obstetrics negative OB ROS                            Anesthesia Physical Anesthesia Plan  ASA: IV  Anesthesia Plan: MAC   Post-op Pain Management:    Induction:   PONV Risk Score and Plan: 0 and Treatment may vary due to age or medical condition and Propofol infusion  Airway Management Planned: Natural Airway and Nasal Cannula  Additional Equipment: None  Intra-op Plan:   Post-operative Plan:   Informed Consent: I have reviewed the patients History and Physical, chart, labs and discussed the procedure including the risks, benefits and alternatives for the proposed anesthesia with the patient or authorized representative who has indicated his/her understanding and acceptance.       Plan Discussed with: CRNA  Anesthesia Plan Comments:        Anesthesia Quick Evaluation

## 2021-01-02 NOTE — Plan of Care (Signed)
  Problem: Education: Goal: Knowledge of secondary prevention will improve Outcome: Progressing Goal: Knowledge of patient specific risk factors addressed and post discharge goals established will improve Outcome: Progressing Goal: Individualized Educational Video(s) Outcome: Progressing   

## 2021-01-02 NOTE — Progress Notes (Signed)
Physical Therapy Treatment Patient Details Name: Antonio Rogers MRN: [id number] DOB: 1986/07/25 Today's Date: 01/02/2021    History of Present Illness 35 year old with a history of remote cocaine abuse admitted with right-sided watershed infarcts, right carotid artery thrombus , status post thrombectomy by neuro IR on 12/29/2020.    PT Comments    The pt was able to make great progress with functional mobility and transfers at this time, completing bed mobility, transfers, and gait with supervision only, no physical assistance. The pt was able to complete stair training with use of single rail without physical assist to maintain stability. However, the pt does continue to present with deficits in LUE fine motor and coordination, as well as with complex problem solving tasks such as way-finding in the hospital. The pt will continue to benefit from skilled therapies to address deficits in dynamic stability, strength, and coordination, but would be safe to d/c home with 24/7 assist of his parents.    Follow Up Recommendations  Outpatient PT;Supervision/Assistance - 24 hour     Equipment Recommendations   (shower seat)       Precautions / Restrictions Precautions Precautions: Fall Precaution Comments: L inattention Restrictions Weight Bearing Restrictions: No    Mobility  Bed Mobility Overal bed mobility: Needs Assistance Bed Mobility: Supine to Sit     Supine to sit: Modified independent (Device/Increase time)     General bed mobility comments: pt able to come to long sitting and transition to EOB without assist, slightly increased time  Transfers Overall transfer level: Needs assistance Equipment used: None Transfers: Sit to/from Stand Sit to Stand: Supervision         General transfer comment: supervision to stand from EOB without UE support or assist. no evidence of instability  Ambulation/Gait Ambulation/Gait assistance: Supervision Gait Distance (Feet): 200  Feet Assistive device: None Gait Pattern/deviations: Step-through pattern Gait velocity: reduced   General Gait Details: slowed, step-through gait but no LOB at this time. improved symmetry of stride length, difficulty way-finding, but able to attend bilaterally when cued   Stairs Stairs: Yes Stairs assistance: Min guard Stair Management: One rail Right;Alternating pattern;Forwards Number of Stairs: 2 (x3; 1x10 with each leg leading) General stair comments: pt able to complete short bouts of stair navigation with RUE support and minG for safety, able to complete step-up with each leg leading without evidence of instability or buckling of LLE   Wheelchair Mobility    Modified Rankin (Stroke Patients Only) Modified Rankin (Stroke Patients Only) Pre-Morbid Rankin Score: No symptoms Modified Rankin: Moderately severe disability     Balance Overall balance assessment: Needs assistance Sitting-balance support: No upper extremity supported;Feet supported Sitting balance-Leahy Scale: Good       Standing balance-Leahy Scale: Good Standing balance comment: supervision for static and dynamic stance without UE support                            Cognition Arousal/Alertness: Awake/alert Behavior During Therapy: WFL for tasks assessed/performed Overall Cognitive Status: Impaired/Different from baseline Area of Impairment: Memory;Safety/judgement;Problem solving                     Memory: Decreased recall of precautions   Safety/Judgement: Decreased awareness of deficits (L inattention)   Problem Solving: Decreased initiation;Difficulty sequencing;Requires verbal cues General Comments: pt with generally functional cog, difficulty with way-finding and problem solving when given directions and navigating around unit. Required cues to address clues in environment and  complete task      Exercises      General Comments General comments (skin integrity, edema,  etc.): VSS on RA, pt with decreased attention to L-side, especially with bed mobility and LUE positioning.      Pertinent Vitals/Pain Pain Assessment: No/denies pain           PT Goals (current goals can now be found in the care plan section) Acute Rehab PT Goals Patient Stated Goal: to return to independent mobility PT Goal Formulation: With patient Time For Goal Achievement: 01/13/21 Potential to Achieve Goals: Good Progress towards PT goals: Progressing toward goals    Frequency    Min 4X/week      PT Plan Discharge plan needs to be updated       AM-PAC PT "6 Clicks" Mobility   Outcome Measure  Help needed turning from your back to your side while in a flat bed without using bedrails?: None Help needed moving from lying on your back to sitting on the side of a flat bed without using bedrails?: None Help needed moving to and from a bed to a chair (including a wheelchair)?: A Little Help needed standing up from a chair using your arms (e.g., wheelchair or bedside chair)?: A Little Help needed to walk in hospital room?: A Little Help needed climbing 3-5 steps with a railing? : A Little 6 Click Score: 20    End of Session Equipment Utilized During Treatment: Gait belt Activity Tolerance: Patient tolerated treatment well Patient left: in bed;with call bell/phone within reach;with bed alarm set Nurse Communication: Mobility status PT Visit Diagnosis: Other abnormalities of gait and mobility (R26.89);Other symptoms and signs involving the nervous system (R29.898);Hemiplegia and hemiparesis Hemiplegia - Right/Left: Left Hemiplegia - dominant/non-dominant: Non-dominant Hemiplegia - caused by: Cerebral infarction     Time: 3532-9924 PT Time Calculation (min) (ACUTE ONLY): 21 min  Charges:  $Gait Training: 8-22 mins                     Karma Ganja, PT, DPT   Acute Rehabilitation Department Pager #: [phone]   Otho Bellows 01/02/2021, 5:25 PM

## 2021-01-03 ENCOUNTER — Inpatient Hospital Stay (HOSPITAL_COMMUNITY): Payer: BC Managed Care – PPO | Admitting: Anesthesiology

## 2021-01-03 ENCOUNTER — Inpatient Hospital Stay (HOSPITAL_COMMUNITY): Payer: BC Managed Care – PPO

## 2021-01-03 ENCOUNTER — Telehealth: Payer: Self-pay | Admitting: Physician Assistant

## 2021-01-03 ENCOUNTER — Encounter (HOSPITAL_COMMUNITY): Payer: Self-pay | Admitting: Neurology

## 2021-01-03 ENCOUNTER — Encounter (HOSPITAL_COMMUNITY)
Admission: EM | Disposition: A | Discharge status: Home / Self Care | Payer: Self-pay | Source: Home / Self Care | Attending: Neurology

## 2021-01-03 DIAGNOSIS — I639 Cerebral infarction, unspecified: Secondary | ICD-10-CM

## 2021-01-03 DIAGNOSIS — I10 Essential (primary) hypertension: Secondary | ICD-10-CM

## 2021-01-03 HISTORY — PX: TEE WITHOUT CARDIOVERSION: SHX5443

## 2021-01-03 HISTORY — PX: BUBBLE STUDY: SHX6837

## 2021-01-03 LAB — COMPREHENSIVE METABOLIC PANEL
ALT: 17 U/L (ref 0–44)
AST: 33 U/L (ref 15–41)
Albumin: 2.4 g/dL — ABNORMAL LOW (ref 3.5–5.0)
Alkaline Phosphatase: 80 U/L (ref 38–126)
Anion gap: 9 (ref 5–15)
BUN: 9 mg/dL (ref 6–20)
CO2: 22 mmol/L (ref 22–32)
Calcium: 8.6 mg/dL — ABNORMAL LOW (ref 8.9–10.3)
Chloride: 101 mmol/L (ref 98–111)
Creatinine, Ser: 0.69 mg/dL (ref 0.61–1.24)
GFR, Estimated: >60 mL/min (ref >60)
Glucose, Bld: 164 mg/dL — ABNORMAL HIGH (ref 70–99)
Potassium: 3.7 mmol/L (ref 3.5–5.1)
Sodium: 132 mmol/L — ABNORMAL LOW (ref 135–145)
Total Bilirubin: 0.4 mg/dL (ref 0.3–1.2)
Total Protein: 5.4 g/dL — ABNORMAL LOW (ref 6.5–8.1)

## 2021-01-03 LAB — PROTHROMBIN GENE MUTATION

## 2021-01-03 LAB — CBC
HCT: 30.9 % — ABNORMAL LOW (ref 39.0–52.0)
Hemoglobin: 11.1 g/dL — ABNORMAL LOW (ref 13.0–17.0)
MCH: 34.5 pg — ABNORMAL HIGH (ref 26.0–34.0)
MCHC: 35.9 g/dL (ref 30.0–36.0)
MCV: 96 fL (ref 80.0–100.0)
Platelets: 261 10*3/uL (ref 150–400)
RBC: 3.22 MIL/uL — ABNORMAL LOW (ref 4.22–5.81)
RDW: 18.6 % — ABNORMAL HIGH (ref 11.5–15.5)
WBC: 15.9 10*3/uL — ABNORMAL HIGH (ref 4.0–10.5)
nRBC: 0 % (ref 0.0–0.2)

## 2021-01-03 SURGERY — ECHOCARDIOGRAM, TRANSESOPHAGEAL
Anesthesia: Monitor Anesthesia Care

## 2021-01-03 MED ORDER — SODIUM CHLORIDE 0.9 % IV SOLN: INTRAVENOUS | Status: DC | PRN

## 2021-01-03 MED ORDER — CLOPIDOGREL BISULFATE 75 MG PO TABS: 75.0000 mg | ORAL_TABLET | Freq: Every day | ORAL | 0 refills | Status: DC

## 2021-01-03 MED ORDER — LIDOCAINE HCL (CARDIAC) PF 100 MG/5ML IV SOSY
PREFILLED_SYRINGE | INTRAVENOUS | Status: DC | PRN
  Administered 2021-01-03: 60 mg via INTRATRACHEAL

## 2021-01-03 MED ORDER — VITAMIN D3 25 MCG PO TABS: 1000.0000 [IU] | ORAL_TABLET | Freq: Every day | ORAL | 0 refills | Status: AC

## 2021-01-03 MED ORDER — PROPOFOL 500 MG/50ML IV EMUL
INTRAVENOUS | Status: DC | PRN
  Administered 2021-01-03: 125 ug/kg/min via INTRAVENOUS

## 2021-01-03 MED ORDER — SODIUM CHLORIDE 0.9 % IV SOLN: INTRAVENOUS | Status: DC

## 2021-01-03 MED ORDER — NICOTINE 14 MG/24HR TD PT24: 14.0000 mg | MEDICATED_PATCH | Freq: Every day | TRANSDERMAL | 0 refills | Status: DC

## 2021-01-03 MED ORDER — ATORVASTATIN CALCIUM 20 MG PO TABS: 20.0000 mg | ORAL_TABLET | Freq: Every day | ORAL | 1 refills | Status: AC

## 2021-01-03 MED ORDER — PROMETHAZINE HCL 25 MG/ML IJ SOLN: 6.2500 mg | INTRAMUSCULAR | Status: DC | PRN

## 2021-01-03 MED ORDER — PROPOFOL 10 MG/ML IV BOLUS
INTRAVENOUS | Status: DC | PRN
  Administered 2021-01-03 (×2): 30 mg via INTRAVENOUS
  Administered 2021-01-03 (×2): 20 mg via INTRAVENOUS

## 2021-01-03 MED ORDER — ASPIRIN EC 81 MG PO TBEC: 81.0000 mg | DELAYED_RELEASE_TABLET | Freq: Every day | ORAL | 2 refills | Status: DC

## 2021-01-03 NOTE — Anesthesia Procedure Notes (Signed)
Procedure Name: MAC Date/Time: 01/03/2021 9:49 AM Performed by: Kathryne Hitch, CRNA Pre-anesthesia Checklist: Patient identified, Emergency Drugs available, Suction available and Patient being monitored Patient Re-evaluated:Patient Re-evaluated prior to induction Oxygen Delivery Method: Nasal cannula Preoxygenation: Pre-oxygenation with 100% oxygen Induction Type: IV induction Placement Confirmation: positive ETCO2 Dental Injury: Teeth and Oropharynx as per pre-operative assessment

## 2021-01-03 NOTE — Progress Notes (Signed)
OT Cancellation Note  Patient Details Name: Fender Herder MRN: [id number] DOB: [birth year]   Cancelled Treatment:    Reason Eval/Treat Not Completed: Patient at procedure or test/ unavailable (Will return as schedule allows.)  [place], OTR/L Acute Rehab Pager: [phone] Office: [phone] 01/03/2021, 9:28 AM

## 2021-01-03 NOTE — Anesthesia Postprocedure Evaluation (Signed)
Anesthesia Post Note  Patient: Antonio Rogers  Procedure(s) Performed: TRANSESOPHAGEAL ECHOCARDIOGRAM (TEE) (N/A )     Patient location during evaluation: PACU Anesthesia Type: MAC Level of consciousness: awake and alert and oriented Pain management: pain level controlled Vital Signs Assessment: post-procedure vital signs reviewed and stable Respiratory status: spontaneous breathing, nonlabored ventilation and respiratory function stable Cardiovascular status: blood pressure returned to baseline Postop Assessment: no apparent nausea or vomiting Anesthetic complications: no   No complications documented.  Last Vitals:  Vitals:   01/03/21 1014 01/03/21 1024  BP: (!) 110/59 119/79  Pulse: 91 89  Resp: (!) 30 12  Temp: 36.7 C   SpO2: 92% 96%    Last Pain:  Vitals:   01/03/21 1024  TempSrc:   PainSc: 0-No pain                 Brennan Bailey

## 2021-01-03 NOTE — Progress Notes (Signed)
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   OT comments  Pt progressing towards established OT goals. Continue to present with decreased cognition, attention to left, and functional use of LUE. Pt donning underwear and requiring Min A and Mod cues due to poor error recognition, problem solving, and processing (attempting to don underwear on backwards, inside-out, and then backwards again). Pt with poor bilateral coordination as seen during LB dressing and hand hygiene at sink. Pt mother arriving at end of session. Educating pt and mother on weight bearing benefits. Pt would continue to benefit from intensive OT through OP to address cognition and functional use of LUE. However, CIR declined and pt will require OP OT. Will return later with HEP for LUE.    Follow Up Recommendations  Outpatient OT;Supervision/Assistance - 24 hour;CIR (Neuro OP. Contnue to recommend CIR for intensive OT to address cognition and LUE. however, declined CIR and will require OP OT.)    Equipment Recommendations  3 in 1 bedside commode    Recommendations for Other Services Rehab consult;PT consult;Speech consult    Precautions / Restrictions Precautions Precautions: Fall Precaution Comments: L inattention Restrictions Weight Bearing Restrictions: No       Mobility Bed Mobility Overal bed mobility: Needs Assistance Bed Mobility: Supine to Sit;Sit to Supine     Supine to sit: Supervision Sit to supine: Supervision   General bed mobility comments: Supervision for safety  Transfers Overall transfer level: Needs assistance   Transfers: Sit to/from Stand Sit to Stand: Supervision         General transfer  comment: Supervision for safety    Balance Overall balance assessment: Needs assistance Sitting-balance support: No upper extremity supported;Feet supported Sitting balance-Leahy Scale: Good     Standing balance support: No upper extremity supported;During functional activity Standing balance-Leahy Scale: Good                             ADL either performed or assessed with clinical judgement   ADL Overall ADL's : Needs assistance/impaired Eating/Feeding: Minimal assistance;Sitting Eating/Feeding Details (indicate cue type and reason): Requiring assistance for bilateral coordination and cutting food. Pt attempting to use RUE only and dropping food in his lap. Grooming: Wash/dry hands;Supervision/safety;Standing Grooming Details (indicate cue type and reason): Pt performing hand hygiene at sink with supervision. Pt presenting with poor bilateral coorindation and using RUE to manage LUE during hand hygiene. Educating pt on weight bearing techniques for neuro recovery.             Lower Body Dressing: Minimal assistance;Sit to/from stand;Cueing for sequencing;Cueing for compensatory techniques Lower Body Dressing Details (indicate cue type and reason): Pt requiring Mod cues to sequence donning of underwear. Pt attempting to don underwear but putting them on backards. Poor error recognition and requiring cues to restart. Pt then attempting to put underwear on inside out and then backwards. Requiring Min A for correctly target and put LLE into left pant leg. Toilet Transfer: Supervision/safety;Ambulation           Functional mobility during ADLs: Supervision/safety General ADL Comments: Pt continues to present with poor cognition and functional use of LUE impacting his funtional performance     Vision   Alignment/Gaze Preference:  Gaze right Additional Comments: Continues to present with tendency for right gaze and inattention to left   Perception     Praxis       Cognition Arousal/Alertness: Suspect due to medications Behavior During Therapy: WFL for tasks assessed/performed Overall Cognitive Status: Impaired/Different from baseline Area of Impairment: Problem solving;Awareness;Following commands                       Following Commands: Follows one step commands with increased time;Follows multi-step commands inconsistently Safety/Judgement: Decreased awareness of deficits (L inattention) Awareness: Intellectual;Emergent (Poor error recognition) Problem Solving: Difficulty sequencing;Requires verbal cues General Comments: Upon arrival, pt sleeping and reporting he still felt groggy from anathesia. Pt however pt reporting Mod cues for correctly sequencing donning of underwear. Pt first attempting to put underwear on backwards, then inside out, and then backwards again. Pt with poor error recognition. Pt very motivated and receptive of education.        Exercises Exercises: Other exercises Other Exercises Other Exercises: Education on weight bearing through LUE   Shoulder Instructions       General Comments Mother arriving in middle of session    Pertinent Vitals/ Pain       Pain Assessment: No/denies pain  Home Living                                          Prior Functioning/Environment              Frequency  Min 2X/week        Progress Toward Goals  OT Goals(current goals can now be found in the care plan section)  Progress towards OT goals: Progressing toward goals  Acute Rehab OT Goals Patient Stated Goal: to return to independent mobility OT Goal Formulation: With patient Time For Goal Achievement: 01/13/21 Potential to Achieve Goals: Good ADL Goals Pt Will Perform Grooming: with min guard assist;sitting Pt Will Perform Upper Body Bathing: with min guard assist;sitting Pt Will Transfer to Toilet: with min guard assist;ambulating;bedside commode Pt/caregiver will Perform Home  Exercise Program: Left upper extremity;Increased strength;With theraputty;With Supervision;With written HEP provided Additional ADL Goal #1: Pt will complete complete bed mobility supervision level as precursor to adls  Plan Discharge plan needs to be updated    Co-evaluation                 AM-PAC OT "6 Clicks" Daily Activity     Outcome Measure   Help from another person eating meals?: A Little Help from another person taking care of personal grooming?: A Little Help from another person toileting, which includes using toliet, bedpan, or urinal?: A Little Help from another person bathing (including washing, rinsing, drying)?: A Little Help from another person to put on and taking off regular upper body clothing?: A Little Help from another person to put on and taking off regular lower body clothing?: A Lot 6 Click Score: 17    End of Session    OT Visit Diagnosis: Unsteadiness on feet (R26.81);Muscle weakness (generalized) (M62.81)   Activity Tolerance Patient tolerated treatment well   Patient Left in bed;with call bell/phone within reach;with family/visitor present   Nurse Communication Mobility status        Time: [id number] OT Time Calculation (min): 35 min  Charges: OT General Charges $OT Visit: 1 Visit OT Treatments $Self Care/Home Management : 23-37 mins  Egor Fullilove  Yoselin Amerman MSOT, OTR/L Acute Rehab Pager: [phone] Office: [place] 01/03/2021, 2:36 PM

## 2021-01-03 NOTE — Transfer of Care (Signed)
Immediate Anesthesia Transfer of Care Note  Patient: Antonio Rogers  Procedure(s) Performed: TRANSESOPHAGEAL ECHOCARDIOGRAM (TEE) (N/A )  Patient Location: Endoscopy Unit  Anesthesia Type:MAC  Level of Consciousness: drowsy and patient cooperative  Airway & Oxygen Therapy: Patient Spontanous Breathing  Post-op Assessment: Report given to RN and Post -op Vital signs reviewed and stable  Post vital signs: Reviewed and stable  Last Vitals:  Vitals Value Taken Time  BP 110/59 01/03/21 1014  Temp    Pulse 92 01/03/21 1014  Resp 28 01/03/21 1014  SpO2 93 % 01/03/21 1014  Vitals shown include unvalidated device data.  Last Pain:  Vitals:   01/03/21 0849  TempSrc: Axillary  PainSc: 0-No pain         Complications: No complications documented.

## 2021-01-03 NOTE — Discharge Summary (Addendum)
Stroke Discharge Summary  Patient ID: Antonio Rogers   MRN: [id number]      DOB: [birth year]  Date of Admission: 12/29/2020 Date of Discharge: 01/03/2021  Attending Physician:  Garvin Fila, MD, Stroke MD Consultant(s):     Patient's PCP:  Patient, No Pcp Per  DISCHARGE DIAGNOSIS:  1. Right MCA scattered patchy infarct due to right ICA long segment thrombus s/p IR with TIIC3, embolic secondary to unclear source likely carotid dissection related to fall 10 days agopossibly underlying dissection from a fall 10 days prior. 2. LUE weakness  3. 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 Swallow whole.   atorvastatin 20 MG tablet Commonly known as: LIPITOR Take 1 tablet (20 mg total) by mouth daily. Start taking on: January 04, 2021   clopidogrel 75 MG tablet Commonly known as: Plavix Take 1 tablet (75 mg total) by mouth daily.   hydrOXYzine 50 MG tablet Commonly known as: ATARAX/VISTARIL Take 100 mg by mouth daily.   nicotine 14 mg/24hr patch Commonly known as: NICODERM CQ - dosed in mg/24 hours Place 1 patch (14 mg total) onto the skin daily. Start taking on: January 04, 2021   traZODone 100 MG tablet Commonly known as: DESYREL Take 150 mg by mouth at bedtime.   venlafaxine XR 75 MG 24 hr capsule Commonly known as: EFFEXOR-XR Take 150 mg by mouth daily.   Vitamin D3 25 MCG tablet Commonly known as: Vitamin D Take 1 tablet (1,000 Units total) by mouth daily. Start taking on:  January 04, 2021               Durable Medical Equipment  (From admission, onward)           Start     Ordered   01/03/21 1434  For home use only DME 3 n 1  Once        01/03/21 1433           LABORATORY STUDIES CBC    Component Value Date/Time   WBC 15.9 (H) 01/03/2021 0314   RBC 3.22 (L) 01/03/2021 0314   HGB 11.1 (L) 01/03/2021 0314   HCT 30.9 (L) 01/03/2021 0314   PLT 261 01/03/2021 0314   MCV 96.0 01/03/2021 0314   MCH 34.5 (H) 01/03/2021 0314   MCHC 35.9 01/03/2021 0314   RDW 18.6 (H) 01/03/2021 0314   LYMPHSABS 1.5 12/30/2020 0316   MONOABS 0.6 12/30/2020 0316   EOSABS 0.0 12/30/2020 0316   BASOSABS 0.0 12/30/2020 0316   CMP    Component Value Date/Time   NA 132 (L) 01/03/2021 0314   K 3.7 01/03/2021 0314   CL 101 01/03/2021 0314   CO2 22 01/03/2021 0314   GLUCOSE 164 (H) 01/03/2021 0314   BUN 9 01/03/2021 0314   CREATININE 0.69 01/03/2021 0314   CALCIUM 8.6 (L) 01/03/2021 0314   PROT 5.4 (L) 01/03/2021 [id number]  ALBUMIN 2.4 (L) 01/03/2021 0314   AST 33 01/03/2021 0314   ALT 17 01/03/2021 0314   ALKPHOS 80 01/03/2021 0314   BILITOT 0.4 01/03/2021 0314   GFRNONAA >60 01/03/2021 0314   GFRAA >90 02/19/2012 0957   COAGS Lab Results  Component Value Date   INR 1.3 (H) 12/29/2020   Lipid Panel    Component Value Date/Time   CHOL 93 12/30/2020 0316   TRIG 151 (H) 12/30/2020 0316   HDL 12 (L) 12/30/2020 0316   CHOLHDL 7.8 12/30/2020 0316   VLDL 30 12/30/2020 0316   LDLCALC 51 12/30/2020 0316   HgbA1C  Lab Results  Component Value Date   HGBA1C 5.5 12/30/2020   Urinalysis    Component Value Date/Time   COLORURINE AMBER (A) 12/31/2020 0132   APPEARANCEUR CLEAR 12/31/2020 0132   LABSPEC 1.024 12/31/2020 0132   PHURINE 7.0 12/31/2020 0132   GLUCOSEU NEGATIVE 12/31/2020 0132   HGBUR NEGATIVE 12/31/2020 0132   BILIRUBINUR NEGATIVE 12/31/2020 0132   BILIRUBINUR n 02/15/2016 1012   KETONESUR 5 (A) 12/31/2020 0132   PROTEINUR  NEGATIVE 12/31/2020 0132   UROBILINOGEN 0.2 02/15/2016 1012   UROBILINOGEN 0.2 02/19/2012 1014   NITRITE NEGATIVE 12/31/2020 0132   LEUKOCYTESUR NEGATIVE 12/31/2020 0132   Urine Drug Screen     Component Value Date/Time   LABOPIA NONE DETECTED 12/31/2020 0132   COCAINSCRNUR NONE DETECTED 12/31/2020 0132   LABBENZ NONE DETECTED 12/31/2020 0132   AMPHETMU NONE DETECTED 12/31/2020 0132   THCU NONE DETECTED 12/31/2020 0132   LABBARB NONE DETECTED 12/31/2020 0132    Alcohol Level    Component Value Date/Time   ETH <10 12/29/2020 1222                                                                                               SIGNIFICANT DIAGNOSTIC STUDIES CT Code Stroke CTA Head W/WO contrast  Result Date: 12/29/2020 CLINICAL DATA:  Left-sided weakness.  Stroke suspected. EXAM: CT ANGIOGRAPHY HEAD AND NECK TECHNIQUE: Multidetector CT imaging of the head and neck was performed using the standard protocol during bolus administration of intravenous contrast. Multiplanar CT image reconstructions and MIPs were obtained to evaluate the vascular anatomy. Carotid stenosis measurements (when applicable) are obtained utilizing NASCET criteria, using the distal internal carotid diameter as the denominator. CONTRAST:  80mL OMNIPAQUE IOHEXOL 350 MG/ML SOLN COMPARISON:  Head CT December 29, 2020. FINDINGS: CTA NECK FINDINGS Aortic arch: Standard branching. Imaged portion shows no evidence of aneurysm or dissection. No significant stenosis of the major arch vessel origins. Right carotid system: A mural thrombus is seen in the right carotid bulb extending superiorly along the proximal and mid cervical segment of the right ICA resulting in severe stenosis. Left carotid system: Mild atherosclerotic changes. No evidence of dissection, stenosis (50% or greater) or occlusion. Vertebral arteries: Codominant. No evidence of dissection, stenosis (50% or greater) or occlusion. Skeleton: No acute findings. Other neck:  Heterogeneous thyroid gland. Upper chest: Negative Review of the MIP images confirms the above findings CTA HEAD FINDINGS Anterior circulation: Intracranial right ICA is patent. Luminal irregularity at the right distal M2/M3/MCA  may represent subocclusive clot. The bilateral ACA and the left MCA vascular trees are patent. Posterior circulation: No significant stenosis, proximal occlusion, aneurysm, or vascular malformation. Venous sinuses: As permitted by contrast timing, patent. Review of the MIP images confirms the above findings IMPRESSION: 1. Thrombus in the right carotid bulb extending superiorly along the proximal and mid cervical segment of the right ICA resulting in severe stenosis. 2. Luminal irregularity at the right distal M2/M3/MCA may represent subocclusive clot. These results were called by telephone at the time of interpretation on 12/29/2020 at 1:10 pm to provider [facility] , who verbally acknowledged these results. Electronically Signed   By: Pedro Earls M.D.   On: 12/29/2020 13:19   CT HEAD WO CONTRAST  Result Date: 12/29/2020 CLINICAL DATA:  Stroke. Neuro deficit, acute, stroke suspected. Post IR thrombectomy. EXAM: CT HEAD WITHOUT CONTRAST TECHNIQUE: Contiguous axial images were obtained from the base of the skull through the vertex without intravenous contrast. COMPARISON:  Brain MRI 12/29/2020, CT angiogram head/neck 12/29/2020, noncontrast head CT 12/29/2020. FINDINGS: Brain: Redemonstrated patchy acute cortical and subcortical infarcts within the right frontal, parietal, occipital and temporal lobes within the right MCA territory and right MCA/ACA and MCA/PCA watershed territories. These infarcts do not appear significantly changed in extent as compared to the brain MRI performed earlier today and are again overall moderate in volume. Unchanged subtle petechial hemorrhage at site of an acute infarct within the right frontal lobe (series 3, image 20) and right  occipital lobe (series 3, image 15). No frank hemorrhagic conversion. No significant mass effect. Background mild ill-defined hypoattenuation within the cerebral white matter is nonspecific, but compatible with chronic small vessel ischemic disease. No extra-axial fluid collection. No evidence of intracranial mass. No midline shift. Vascular: Residual intravascular contrast limits evaluation for hyperdense vessels. Skull: Normal. Negative for fracture or focal lesion. Sinuses/Orbits: Visualized orbits show no acute finding. Mild bilateral ethmoid sinus mucosal thickening. IMPRESSION: Patchy acute cortical and subcortical infarcts, within the right cerebral hemisphere in the right MCA territory and right MCA/ACA and MCA/PCA watershed territories, not significantly changed in extent from the brain MRI performed earlier today. Unchanged subtle petechial hemorrhage at sites of acute infarct within the right frontal and occipital lobes. No significant mass effect. Background mild chronic small vessel ischemic disease. Mild ethmoid sinus mucosal thickening. Electronically Signed   By: Kellie Simmering DO   On: 12/29/2020 16:32   CT Code Stroke CTA Neck W/WO contrast  Result Date: 12/29/2020 CLINICAL DATA:  Left-sided weakness.  Stroke suspected. EXAM: CT ANGIOGRAPHY HEAD AND NECK TECHNIQUE: Multidetector CT imaging of the head and neck was performed using the standard protocol during bolus administration of intravenous contrast. Multiplanar CT image reconstructions and MIPs were obtained to evaluate the vascular anatomy. Carotid stenosis measurements (when applicable) are obtained utilizing NASCET criteria, using the distal internal carotid diameter as the denominator. CONTRAST:  61mL OMNIPAQUE IOHEXOL 350 MG/ML SOLN COMPARISON:  Head CT December 29, 2020. FINDINGS: CTA NECK FINDINGS Aortic arch: Standard branching. Imaged portion shows no evidence of aneurysm or dissection. No significant stenosis of the major arch vessel  origins. Right carotid system: A mural thrombus is seen in the right carotid bulb extending superiorly along the proximal and mid cervical segment of the right ICA resulting in severe stenosis. Left carotid system: Mild atherosclerotic changes. No evidence of dissection, stenosis (50% or greater) or occlusion. Vertebral arteries: Codominant. No evidence of dissection, stenosis (50% or greater) or occlusion. Skeleton: No acute findings. Other  neck: Heterogeneous thyroid gland. Upper chest: Negative Review of the MIP images confirms the above findings CTA HEAD FINDINGS Anterior circulation: Intracranial right ICA is patent. Luminal irregularity at the right distal M2/M3/MCA may represent subocclusive clot. The bilateral ACA and the left MCA vascular trees are patent. Posterior circulation: No significant stenosis, proximal occlusion, aneurysm, or vascular malformation. Venous sinuses: As permitted by contrast timing, patent. Review of the MIP images confirms the above findings IMPRESSION: 1. Thrombus in the right carotid bulb extending superiorly along the proximal and mid cervical segment of the right ICA resulting in severe stenosis. 2. Luminal irregularity at the right distal M2/M3/MCA may represent subocclusive clot. These results were called by telephone at the time of interpretation on 12/29/2020 at 1:10 pm to provider [facility] , who verbally acknowledged these results. Electronically Signed   By: Pedro Earls M.D.   On: 12/29/2020 13:19   MR BRAIN WO CONTRAST  Result Date: 12/29/2020 CLINICAL DATA:  Neuro deficit, acute, stroke suspected. EXAM: MRI HEAD WITHOUT CONTRAST TECHNIQUE: Multiplanar, multiecho pulse sequences of the brain and surrounding structures were obtained without intravenous contrast. COMPARISON:  Noncontrast head CT and CT angiogram head/neck performed earlier today 12/29/2020. FINDINGS: Brain: Cerebral volume is normal for age. There are patchy cortical/subcortical  infarcts within the right cerebral hemisphere within the right frontal, parietal, occipital and temporal lobes as well as right insula. These acute infarcts are present within the right MCA territory as well as MCA/PCA and MCA/ACA watershed territories. These infarcts overall moderate in volume. No significant mass effect at this time. There is mild SWI signal loss and T1 hyperintensity associated with acute infarcts within the right frontal and occipital lobes consistent with petechial hemorrhage. Background mild multifocal T2/FLAIR hyperintensity within the cerebral white matter is nonspecific, but compatible with chronic small vessel ischemic disease. No evidence of intracranial mass. No extra-axial fluid collection. No midline shift. Vascular: Expected proximal arterial flow voids. Skull and upper cervical spine: No focal marrow lesion. Sinuses/Orbits: Visualized orbits show no acute finding. No significant paranasal sinus disease at the imaged levels. IMPRESSION: Patchy acute cortical/subcortical infarcts within the right cerebral hemisphere within the right MCA territory as well as MCA/PCA and MCA/ACA watershed territories. The infarcts are overall moderate in volume. No significant mass effect at this time. Petechial hemorrhage associated with an acute infarcts in the right frontal and right occipital lobes. Background mild chronic small vessel ischemic disease. Electronically Signed   By: Kellie Simmering DO   On: 12/29/2020 14:20   VAS [place] TRANSCRANIAL DOPPLER W BUBBLES  Result Date: 12/31/2020  Transcranial Doppler with Bubble Indications: Stroke. Comparison Study: No prior studies. Performing Technologist: Darlin Coco RDMS  Examination Guidelines: A complete evaluation includes B-mode imaging, spectral Doppler, color Doppler, and power Doppler as needed of all accessible portions of each vessel. Bilateral testing is considered an integral part of a complete examination. Limited examinations for  reoccurring indications may be performed as noted.  Summary: No HITS at rest or during Valsalva. Negative transcranial Doppler Bubble study with no evidence of right to left intracardiac communication.  A vascular evaluation was performed. The right middle cerebral artery was studied. An IV was inserted into the patient's left forearm. Verbal informed consent was obtained.  *See table(s) above for TCD measurements and observations.  Diagnosing physician: Antony Contras MD Electronically signed by Antony Contras MD on 12/31/2020 at 11:11:33 AM.    Final    ECHOCARDIOGRAM COMPLETE  Result Date: 12/30/2020    ECHOCARDIOGRAM  REPORT   Patient Name:   Antonio Rogers Date of Exam: 12/30/2020 Medical Rec #:  [id number]             Height:       73.0 in Accession #:    [id number]            Weight:       220.0 lb Date of Birth:  09-Dec-1985             BSA:          2.241 m Patient Age:    44 years              BP:           120/76 mmHg Patient Gender: M                     HR:           74 bpm. Exam Location:  Inpatient Procedure: 2D Echo Indications:    Stroke I163.9  History:        Patient has no prior history of Echocardiogram examinations.                 Risk Factors:Hypertension.  Sonographer:    Mikki Santee RDCS (AE) Referring Phys: [id number]  1. Left ventricular ejection fraction, by estimation, is 55 to 60%. The left ventricle has normal function. The left ventricle has no regional wall motion abnormalities. There is mild left ventricular hypertrophy. Left ventricular diastolic parameters are consistent with Grade I diastolic dysfunction (impaired relaxation).  2. Right ventricular systolic function is normal. The right ventricular size is normal.  3. The mitral valve is grossly normal. No evidence of mitral valve regurgitation.  4. The aortic valve is tricuspid. Aortic valve regurgitation is not visualized.  5. The inferior vena cava is normal in size with <50% respiratory  variability, suggesting right atrial pressure of 8 mmHg. Comparison(s): No prior Echocardiogram. FINDINGS  Left Ventricle: Left ventricular ejection fraction, by estimation, is 55 to 60%. The left ventricle has normal function. The left ventricle has no regional wall motion abnormalities. The left ventricular internal cavity size was normal in size. There is  mild left ventricular hypertrophy. Left ventricular diastolic parameters are consistent with Grade I diastolic dysfunction (impaired relaxation). Indeterminate filling pressures. Right Ventricle: The right ventricular size is normal. No increase in right ventricular wall thickness. Right ventricular systolic function is normal. Left Atrium: Left atrial size was normal in size. Right Atrium: Right atrial size was normal in size. Pericardium: There is no evidence of pericardial effusion. Mitral Valve: The mitral valve is grossly normal. No evidence of mitral valve regurgitation. Tricuspid Valve: The tricuspid valve is grossly normal. Tricuspid valve regurgitation is trivial. Aortic Valve: The aortic valve is tricuspid. Aortic valve regurgitation is not visualized. Pulmonic Valve: The pulmonic valve was not well visualized. Pulmonic valve regurgitation is not visualized. Aorta: The aortic root and ascending aorta are structurally normal, with no evidence of dilitation. Venous: The inferior vena cava is normal in size with less than 50% respiratory variability, suggesting right atrial pressure of 8 mmHg. IAS/Shunts: The interatrial septum was not well visualized.  LEFT VENTRICLE PLAX 2D LVIDd:         4.00 cm  Diastology LVIDs:         3.05 cm  LV e' medial:    6.22 cm/s LV PW:         1.10 cm  LV E/e' medial:  12.5 LV IVS:        1.20 cm  LV e' lateral:   5.61 cm/s LVOT diam:     2.40 cm  LV E/e' lateral: 13.9 LV SV:         77 LV SV Index:   35 LVOT Area:     4.52 cm  RIGHT VENTRICLE TAPSE (M-mode): 1.2 cm LEFT ATRIUM             Index       RIGHT ATRIUM            Index LA diam:        3.50 cm 1.56 cm/m  RA Area:     13.00 cm LA Vol (A2C):   42.2 ml 18.83 ml/m RA Volume:   23.90 ml  10.67 ml/m LA Vol (A4C):   45.2 ml 20.17 ml/m LA Biplane Vol: 45.9 ml 20.49 ml/m  AORTIC VALVE LVOT Vmax:   88.80 cm/s LVOT Vmean:  66.100 cm/s LVOT VTI:    0.171 m  AORTA Ao Root diam: 3.10 cm MITRAL VALVE MV Area (PHT): 2.95 cm    SHUNTS MV Decel Time: 257 msec    Systemic VTI:  0.17 m MV E velocity: 78.00 cm/s  Systemic Diam: 2.40 cm MV A velocity: 54.00 cm/s MV E/A ratio:  1.44 Lyman Bishop MD Electronically signed by Lyman Bishop MD Signature Date/Time: 12/30/2020/1:43:33 PM    Final    IR PERCUTANEOUS ART THROMBECTOMY/INFUSION INTRACRANIAL INC DIAG ANGIO  Result Date: 01/02/2021 INDICATION: New onset left-sided weakness right gaze deviation. 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 EXAM: 1. EMERGENT LARGE VESSEL OCCLUSION THROMBOLYSIS (anterior CIRCULATION) COMPARISON:  CT angiogram of the head and neck of December 29, 2020. MEDICATIONS: Ancef 2 g IV antibiotic was administered within 1 hour of the procedure. ANESTHESIA/SEDATION: General anesthesia CONTRAST:  Isovue 300 approximately 80 mL FLUOROSCOPY TIME:  Fluoroscopy Time: 13 minutes 30 seconds (1251 mGy). COMPLICATIONS: None immediate. TECHNIQUE: Following a full explanation of the procedure along with the potential associated complications, an informed witnessed consent was obtained patient's father. The risks of intracranial hemorrhage of 10%, worsening neurological deficit, ventilator dependency, death and inability to revascularize were all reviewed in detail with the patient's father. The patient was then put under general anesthesia by the Department of Anesthesiology at [facility]. The right groin was prepped and draped in the usual sterile fashion. Thereafter using modified Seldinger  technique, transfemoral access into the right common femoral artery was obtained without difficulty. Over a 0.035 inch guidewire an 8 [place] 25 cm Pinnacle sheath was inserted. Through this, and also over a 0.035 inch guidewire a combination of a 5.5 [place] Berenstein support catheter inside of an 087 balloon guide catheter was advanced to the aortic arch region and selectively positioned in the right common carotid artery proximal to the right common carotid bifurcation. The guidewire, and the support catheter were removed. Good aspiration obtained from the hub of the balloon guide catheter. An arteriogram was then performed centered extra cranially and intracranially. FINDINGS: The right common carotid arteriogram demonstrates the right external carotid artery and its major branches to be widely patent. The right internal carotid artery at the bulb demonstrates approximately 30% narrowing secondary to a smooth shallow atherosclerotic plaque. No evidence of ulcerations noted. There is slow ascent of contrast in the right internal carotid artery extra cranially to  the petrous cervical junction. There is a long segment smooth filling defect hugging the entirety of the diameter of the right internal carotid artery to level of C1 the site of the severe stenosis. Distal to this the right internal carotid artery, the cervical, the petrous and the cavernous segments appear widely patent. The supraclinoid right ICA is widely patent. Opacification is seen of the right middle cerebral artery distribution with no evidence of gross large vessel or medium vessel occlusions. Unopacified blood is seen in the middle cerebral artery from the contralateral left internal carotid artery via the anterior communicating artery. PROCEDURE: ENDOVASCULAR COMPLETE REVASCULARIZATION OF RIGHT INTERNAL CAROTID ARTERY Through the balloon guide catheter, a 136 cm 071 Zoom aspiration catheter was advanced over a 0.035 inch Roadrunner  guidewire and positioned just distal to the balloon guide catheter proximal to the right internal carotid artery. The guidewire was removed. Good aspiration obtained from the hub of the aspiration catheter. The balloon guide catheter was inflated just proximal to the origin of the right internal carotid artery for proximal flow arrest. Over a 0.035 inch Roadrunner guidewire, the Zoom aspiration catheter was advanced into the proximal 1/3 of the right internal carotid artery. The guidewire was removed. Thereafter, as constant aspiration was applied at the hub of the Zoom aspiration catheter with aspiration device, the Zoom guidewire was advanced with minimal resistance to the petrous segment of the right internal carotid artery. Good aspiration obtained from the hub of the Zoom guide catheter distal to the site of the severe stenosis. Through the Zoom aspiration catheter, an 021 Trevo ProVue microcatheter was advanced over a 0.014 inch standard Synchro micro guidewire with a J-tip configuration. The microcatheter was advanced to the petrous cavernous junction. Guidewire was removed. Good aspiration obtained from the hub of the microcatheter which was connected to continuous heparinized saline infusion. A 6 mm x 40 mm Solitaire X retrieval device was then advanced to the distal end of the microcatheter. This was then deployed in the usual manner by retrieving the delivery microcatheter as far proximally into the Zoom aspiration catheter. Thereafter, aspiration was applied at the hub of the balloon guide catheter with a 20 mL syringe, and with a Penumbra aspiration device at the hub of the Zoom aspiration catheter as the combination of microcatheter and the Zoom aspiration catheter were retrieved and removed. Straw-colored membranous material was noted in the interstices of the retrieval device and also in the canister. Following reversal of flow arrest, a control arteriogram performed through the balloon guide  demonstrated complete revascularization of the right internal carotid artery without evidence of any filling defects, spasm or stenosis. More distally brisk arterial flow was noted into the right middle cerebral artery and the right anterior cerebral artery distributions. A TICI 3 revascularization of the right middle cerebral artery and right anterior cerebral arteries was established. The balloon guide was then removed. A left common carotid arteriogram via a 5 French diagnostic catheter demonstrated the left external carotid artery and its major branches to be widely patent. The left internal carotid artery at the bulb to the cranial skull base demonstrated no evidence of stenosis, irregularities. The petrous, cavernous and the supraclinoid segments demonstrate wide patency. The left middle cerebral artery and the left anterior cerebral artery opacify into the capillary and venous phases. Selective left vertebral artery arteriogram demonstrates the origin of the left vertebral artery to be widely patent. The vessel opacifies to the cranial skull base with wide patency of the left vertebrobasilar junction, the  left posterior-inferior cerebellar artery and the basilar artery, the posterior cerebral arteries, the superior cerebellar arteries and the anterior-inferior cerebellar arteries into the capillary and venous phases on the lateral projection. An 8 French Angio-Seal closure device was used for hemostasis at the right groin puncture site. Distal pulses remained Dopplerable in all four extremities. The patient was left intubated and sent to CT scan for postprocedural CT scan from where the patient was sent to PACU. 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 No underlying intraluminal or endothelial irregularities were identified on the immediate post procedure arteriogram. PLAN: Follow-up CT angiogram of the head and neck in 3 months post discharge. Electronically Signed   By: Luanne Bras M.D.   On: 12/30/2020 11:26   CT HEAD CODE STROKE WO CONTRAST  Result Date: 12/29/2020 CLINICAL DATA:  Code stroke.  Left-sided weakness. EXAM: CT HEAD WITHOUT CONTRAST TECHNIQUE: Contiguous axial images were obtained from the base of the skull through the vertex without intravenous contrast. COMPARISON:  None. FINDINGS: Brain: No abnormality affects the brainstem or cerebellum. The left cerebral hemisphere is normal except for a questionable old white matter infarction in the left parietal region. On the right, there is evidence of acute infarction within the middle cerebral artery territory. Cortical and subcortical low-density seen at the margins of the MCA distribution, with diminished gray-white differentiation elsewhere within the MCA territory. Minimal petechial blood products in the area the right frontal portion of the infarction. No mass effect. No hydrocephalus. Vascular: No hyperdense vessel. 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 Cortical and subcortical low-density at the margins of the infarction, with diminished gray-white differentiation elsewhere within the MCA territory. Minimal petechial blood products in the area of the right frontal portion of the infarction. No mass effect. Aspects score 3. These results were communicated  to Dr. Curly Shores At 12:37 pmon 1/27/2022by text page via the [facility] messaging system. Electronically Signed   By: Nelson Chimes M.D.   On: 12/29/2020 12:38   VAS [place] LOWER EXTREMITY VENOUS (DVT)  Result Date: 12/30/2020  Lower Venous DVT Study Indications: Stroke.  Limitations: Recent RT femoral catheterization- limited visibility of RT CFV. Comparison Study: No prior studies. Performing Technologist: Darlin Coco RDMS  Examination Guidelines: A complete evaluation includes B-mode imaging, spectral Doppler, color Doppler, and power Doppler as needed of all accessible portions of each vessel. Bilateral testing is considered an integral part of a complete examination. Limited examinations for reoccurring indications may be performed as noted. The reflux portion of the exam is performed with the patient in reverse Trendelenburg.  +---------+---------------+---------+-----------+----------+--------------+ RIGHT    CompressibilityPhasicitySpontaneityPropertiesThrombus Aging +---------+---------------+---------+-----------+----------+--------------+ CFV                     Yes      Yes                                 +---------+---------------+---------+-----------+----------+--------------+  FV Prox  Full                                                        +---------+---------------+---------+-----------+----------+--------------+ FV Mid   Full                                                        +---------+---------------+---------+-----------+----------+--------------+ FV DistalFull                                                        +---------+---------------+---------+-----------+----------+--------------+ PFV      Full                                                        +---------+---------------+---------+-----------+----------+--------------+ POP      Full           Yes      Yes                                  +---------+---------------+---------+-----------+----------+--------------+ PTV      Full                                                        +---------+---------------+---------+-----------+----------+--------------+ PERO     Full                                                        +---------+---------------+---------+-----------+----------+--------------+   +---------+---------------+---------+-----------+----------+--------------+ LEFT     CompressibilityPhasicitySpontaneityPropertiesThrombus Aging +---------+---------------+---------+-----------+----------+--------------+ CFV      Full           Yes      Yes                                 +---------+---------------+---------+-----------+----------+--------------+ SFJ      Full                                                        +---------+---------------+---------+-----------+----------+--------------+ FV Prox  Full                                                        +---------+---------------+---------+-----------+----------+--------------+  FV Mid   Full                                                        +---------+---------------+---------+-----------+----------+--------------+ FV DistalFull                                                        +---------+---------------+---------+-----------+----------+--------------+ PFV      Full                                                        +---------+---------------+---------+-----------+----------+--------------+ POP      Full           Yes      Yes                                 +---------+---------------+---------+-----------+----------+--------------+ PTV      Full                                                        +---------+---------------+---------+-----------+----------+--------------+ PERO     Full                                                         +---------+---------------+---------+-----------+----------+--------------+     Summary: RIGHT: - There is no evidence of deep vein thrombosis in the lower extremity. However, portions of this examination were limited- see technologist comments above.  - No cystic structure found in the popliteal fossa.  LEFT: - There is no evidence of deep vein thrombosis in the lower extremity.  - No cystic structure found in the popliteal fossa.  *See table(s) above for measurements and observations. Electronically signed by Jamelle Haring on 12/30/2020 at 5:20:23 PM.    Final    IR ANGIO INTRA EXTRACRAN SEL COM CAROTID INNOMINATE UNI L MOD SED  Result Date: 01/02/2021 INDICATION: New onset left-sided weakness right gaze deviation. 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 EXAM: 1. EMERGENT LARGE VESSEL OCCLUSION THROMBOLYSIS (anterior CIRCULATION) COMPARISON:  CT angiogram of the head and neck of December 29, 2020. MEDICATIONS: Ancef 2 g IV antibiotic was administered within 1 hour of the procedure. ANESTHESIA/SEDATION: General anesthesia CONTRAST:  Isovue 300 approximately 80 mL FLUOROSCOPY TIME:  Fluoroscopy Time: 13 minutes 30 seconds (1251 mGy). COMPLICATIONS: None immediate. TECHNIQUE: Following a full explanation of the procedure along with the potential associated complications, an informed witnessed consent was obtained patient's father. The risks of intracranial hemorrhage of 10%, worsening neurological deficit, ventilator dependency, death and inability to revascularize  were all reviewed in detail with the patient's father. The patient was then put under general anesthesia by the Department of Anesthesiology at [facility]. The right groin was prepped and draped in the usual sterile fashion. Thereafter using modified Seldinger technique, transfemoral access into the right common femoral artery  was obtained without difficulty. Over a 0.035 inch guidewire an 8 [place] 25 cm Pinnacle sheath was inserted. Through this, and also over a 0.035 inch guidewire a combination of a 5.5 [place] Berenstein support catheter inside of an 087 balloon guide catheter was advanced to the aortic arch region and selectively positioned in the right common carotid artery proximal to the right common carotid bifurcation. The guidewire, and the support catheter were removed. Good aspiration obtained from the hub of the balloon guide catheter. An arteriogram was then performed centered extra cranially and intracranially. FINDINGS: The right common carotid arteriogram demonstrates the right external carotid artery and its major branches to be widely patent. The right internal carotid artery at the bulb demonstrates approximately 30% narrowing secondary to a smooth shallow atherosclerotic plaque. No evidence of ulcerations noted. There is slow ascent of contrast in the right internal carotid artery extra cranially to the petrous cervical junction. There is a long segment smooth filling defect hugging the entirety of the diameter of the right internal carotid artery to level of C1 the site of the severe stenosis. Distal to this the right internal carotid artery, the cervical, the petrous and the cavernous segments appear widely patent. The supraclinoid right ICA is widely patent. Opacification is seen of the right middle cerebral artery distribution with no evidence of gross large vessel or medium vessel occlusions. Unopacified blood is seen in the middle cerebral artery from the contralateral left internal carotid artery via the anterior communicating artery. PROCEDURE: ENDOVASCULAR COMPLETE REVASCULARIZATION OF RIGHT INTERNAL CAROTID ARTERY Through the balloon guide catheter, a 136 cm 071 Zoom aspiration catheter was advanced over a 0.035 inch Roadrunner guidewire and positioned just distal to the balloon guide catheter  proximal to the right internal carotid artery. The guidewire was removed. Good aspiration obtained from the hub of the aspiration catheter. The balloon guide catheter was inflated just proximal to the origin of the right internal carotid artery for proximal flow arrest. Over a 0.035 inch Roadrunner guidewire, the Zoom aspiration catheter was advanced into the proximal 1/3 of the right internal carotid artery. The guidewire was removed. Thereafter, as constant aspiration was applied at the hub of the Zoom aspiration catheter with aspiration device, the Zoom guidewire was advanced with minimal resistance to the petrous segment of the right internal carotid artery. Good aspiration obtained from the hub of the Zoom guide catheter distal to the site of the severe stenosis. Through the Zoom aspiration catheter, an 021 Trevo ProVue microcatheter was advanced over a 0.014 inch standard Synchro micro guidewire with a J-tip configuration. The microcatheter was advanced to the petrous cavernous junction. Guidewire was removed. Good aspiration obtained from the hub of the microcatheter which was connected to continuous heparinized saline infusion. A 6 mm x 40 mm Solitaire X retrieval device was then advanced to the distal end of the microcatheter. This was then deployed in the usual manner by retrieving the delivery microcatheter as far proximally into the Zoom aspiration catheter. Thereafter, aspiration was applied at the hub of the balloon guide catheter with a 20 mL syringe, and with a Penumbra aspiration device at the hub of the Zoom aspiration catheter as the combination of microcatheter  and the Zoom aspiration catheter were retrieved and removed. Straw-colored membranous material was noted in the interstices of the retrieval device and also in the canister. Following reversal of flow arrest, a control arteriogram performed through the balloon guide demonstrated complete revascularization of the right internal carotid  artery without evidence of any filling defects, spasm or stenosis. More distally brisk arterial flow was noted into the right middle cerebral artery and the right anterior cerebral artery distributions. A TICI 3 revascularization of the right middle cerebral artery and right anterior cerebral arteries was established. The balloon guide was then removed. A left common carotid arteriogram via a 5 French diagnostic catheter demonstrated the left external carotid artery and its major branches to be widely patent. The left internal carotid artery at the bulb to the cranial skull base demonstrated no evidence of stenosis, irregularities. The petrous, cavernous and the supraclinoid segments demonstrate wide patency. The left middle cerebral artery and the left anterior cerebral artery opacify into the capillary and venous phases. Selective left vertebral artery arteriogram demonstrates the origin of the left vertebral artery to be widely patent. The vessel opacifies to the cranial skull base with wide patency of the left vertebrobasilar junction, the left posterior-inferior cerebellar artery and the basilar artery, the posterior cerebral arteries, the superior cerebellar arteries and the anterior-inferior cerebellar arteries into the capillary and venous phases on the lateral projection. An 8 French Angio-Seal closure device was used for hemostasis at the right groin puncture site. Distal pulses remained Dopplerable in all four extremities. The patient was left intubated and sent to CT scan for postprocedural CT scan from where the patient was sent to PACU. 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 No underlying intraluminal or endothelial irregularities were identified on the immediate post procedure arteriogram. PLAN: Follow-up CT angiogram of the head and neck in 3 months post discharge. Electronically Signed   By: Luanne Bras M.D.   On: 12/30/2020 11:26   IR ANGIO VERTEBRAL SEL VERTEBRAL UNI L MOD SED  Result Date: 01/02/2021 INDICATION: New onset left-sided weakness right gaze deviation. 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 EXAM: 1. EMERGENT LARGE VESSEL OCCLUSION THROMBOLYSIS (anterior CIRCULATION) COMPARISON:  CT angiogram of the head and neck of December 29, 2020. MEDICATIONS: Ancef 2 g IV antibiotic was administered within 1 hour of the procedure. ANESTHESIA/SEDATION: General anesthesia CONTRAST:  Isovue 300 approximately 80 mL FLUOROSCOPY TIME:  Fluoroscopy Time: 13 minutes 30 seconds (1251 mGy). COMPLICATIONS: None immediate. TECHNIQUE: Following a full explanation of the procedure along with the potential associated complications, an informed witnessed consent was obtained patient's father. The risks of intracranial hemorrhage of 10%, worsening neurological deficit, ventilator dependency, death and inability to revascularize were all reviewed in detail with the patient's father. The patient was then put under general anesthesia by the Department of Anesthesiology at [facility]. The right groin was prepped and draped in the usual sterile fashion. Thereafter using modified Seldinger technique, transfemoral access into the right common femoral artery was obtained without difficulty. Over a 0.035 inch guidewire an 8 [place] 25 cm Pinnacle  sheath was inserted. Through this, and also over a 0.035 inch guidewire a combination of a 5.5 [place] Berenstein support catheter inside of an 087 balloon guide catheter was advanced to  the aortic arch region and selectively positioned in the right common carotid artery proximal to the right common carotid bifurcation. The guidewire, and the support catheter were removed. Good aspiration obtained from the hub of the balloon guide catheter. An arteriogram was then performed centered extra cranially and intracranially. FINDINGS: The right common carotid arteriogram demonstrates the right external carotid artery and its major branches to be widely patent. The right internal carotid artery at the bulb demonstrates approximately 30% narrowing secondary to a smooth shallow atherosclerotic plaque. No evidence of ulcerations noted. There is slow ascent of contrast in the right internal carotid artery extra cranially to the petrous cervical junction. There is a long segment smooth filling defect hugging the entirety of the diameter of the right internal carotid artery to level of C1 the site of the severe stenosis. Distal to this the right internal carotid artery, the cervical, the petrous and the cavernous segments appear widely patent. The supraclinoid right ICA is widely patent. Opacification is seen of the right middle cerebral artery distribution with no evidence of gross large vessel or medium vessel occlusions. Unopacified blood is seen in the middle cerebral artery from the contralateral left internal carotid artery via the anterior communicating artery. PROCEDURE: ENDOVASCULAR COMPLETE REVASCULARIZATION OF RIGHT INTERNAL CAROTID ARTERY Through the balloon guide catheter, a 136 cm 071 Zoom aspiration catheter was advanced over a 0.035 inch Roadrunner guidewire and positioned just distal to the balloon guide catheter proximal to the right internal carotid artery. The guidewire was removed. Good aspiration obtained from the hub of the aspiration catheter. The balloon guide catheter was inflated just proximal to the origin of the right internal carotid artery for proximal flow arrest. Over a 0.035 inch  Roadrunner guidewire, the Zoom aspiration catheter was advanced into the proximal 1/3 of the right internal carotid artery. The guidewire was removed. Thereafter, as constant aspiration was applied at the hub of the Zoom aspiration catheter with aspiration device, the Zoom guidewire was advanced with minimal resistance to the petrous segment of the right internal carotid artery. Good aspiration obtained from the hub of the Zoom guide catheter distal to the site of the severe stenosis. Through the Zoom aspiration catheter, an 021 Trevo ProVue microcatheter was advanced over a 0.014 inch standard Synchro micro guidewire with a J-tip configuration. The microcatheter was advanced to the petrous cavernous junction. Guidewire was removed. Good aspiration obtained from the hub of the microcatheter which was connected to continuous heparinized saline infusion. A 6 mm x 40 mm Solitaire X retrieval device was then advanced to the distal end of the microcatheter. This was then deployed in the usual manner by retrieving the delivery microcatheter as far proximally into the Zoom aspiration catheter. Thereafter, aspiration was applied at the hub of the balloon guide catheter with a 20 mL syringe, and with a Penumbra aspiration device at the hub of the Zoom aspiration catheter as the combination of microcatheter and the Zoom aspiration catheter were retrieved and removed. Straw-colored membranous material was noted in the interstices of the retrieval device and also in the canister. Following reversal of flow arrest, a control arteriogram performed through the balloon guide demonstrated complete revascularization of the right internal carotid artery without evidence of any filling defects, spasm or stenosis. More distally brisk arterial flow was noted into the right  middle cerebral artery and the right anterior cerebral artery distributions. A TICI 3 revascularization of the right middle cerebral artery and right anterior cerebral  arteries was established. The balloon guide was then removed. A left common carotid arteriogram via a 5 French diagnostic catheter demonstrated the left external carotid artery and its major branches to be widely patent. The left internal carotid artery at the bulb to the cranial skull base demonstrated no evidence of stenosis, irregularities. The petrous, cavernous and the supraclinoid segments demonstrate wide patency. The left middle cerebral artery and the left anterior cerebral artery opacify into the capillary and venous phases. Selective left vertebral artery arteriogram demonstrates the origin of the left vertebral artery to be widely patent. The vessel opacifies to the cranial skull base with wide patency of the left vertebrobasilar junction, the left posterior-inferior cerebellar artery and the basilar artery, the posterior cerebral arteries, the superior cerebellar arteries and the anterior-inferior cerebellar arteries into the capillary and venous phases on the lateral projection. An 8 French Angio-Seal closure device was used for hemostasis at the right groin puncture site. Distal pulses remained Dopplerable in all four extremities. The patient was left intubated and sent to CT scan for postprocedural CT scan from where the patient was sent to PACU. 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 No underlying intraluminal or endothelial irregularities were identified on the immediate post procedure arteriogram. PLAN: Follow-up CT angiogram of the head and neck in 3 months post discharge. Electronically Signed   By: Luanne Bras M.D.   On: 12/30/2020  11:26   HISTORY OF PRESENT ILLNESS Seward Wingrove is a 35 y.o. male with a past medical history significant for remote cocaine abuse (sober since age 53), hypertension, depression, anxiety. He is right handed.  He presented with LUE weakness, generalized weakness x2 days found on the floor unable to get up. He presented to the ED for further evaluation and care. He was noted to have significant left sided inattention upon presentation.  HOSPITAL COURSE Mr. Hollenbach was evaluated in the ED and was found to have NIH stroke scale score of 8. CTA revealed a free-floating thrombus in the right carotid artery, felt to be the culprit of the strokes, possibly dissection or carotid web underlying the thrombus. In discussion with neurointerventional radiology, patient, family, decision was made to proceed with angiography to clarify the etiology of the thrombus and treat accordingly. Informed consent was obtained. He was taken for thrombectomy which he tolerated well. He was transferred to the neurologic ICU where he was monitored intensively post procedure. His left inattention improved. His LUE weakness persisted with severe intrinsic weakness and moderate wrist weakness. His job involves typing on computers all day. His gait instability markedly improved. He related that he had fallen after slipping the ice about 10 days prior to admission. 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 He denied any history of hypercoagulable state.  Further stroke workup was undertaken. MRI showed  right MCA scattered patchy infarcts with petechial hemorrhage. CT head was repeated post procedur Patchy acute cortical and subcortical infarcts, within the right cerebral hemisphere in the right MCA  territory and right MCA/ACA and MCA/PCA watershed territories. 2D Echo showed EF 55-60%. TEE showed 55-60%. No regional wall motion abnormalities. Lipomatous intraatrial septum. No  PFO or ASD seen by color Doppler. Agitated saline contrast shows late bubbles in left atrium, consistent with non-cardiac shunt. TCD bubble study showed no PFO. Bilateral lower extremity dopplers showed no DVT. Stroke labs showed LDL 51. A1C 5.5.   Patient was initially managed with ASA 325 alone d/t bleeding concern post hemorrhage. He was then transitioned to ASA 81mg  and Plavix 75mg  daily x 3 months then ASA 81 mg monotherapy. Lipitor 20mg  was initiated given LDL at goal. He agreed to smoking cessation recommendation.   Physical, speech and occupational therapists provided evaluation, care and discharge recommendations. Patient made progress rapidly and cleared for home with outpatient therapies. Patient will dc home to mother's house and parents will provide 24/7 assistance.    We advised him to avoid strenuous activity and lifting until further notice.  He will follow up in the stroke clinic in 4 weeks.    DISCHARGE EXAM Blood pressure 124/79, pulse 96, temperature 98 F (36.7 C), temperature source Oral, resp. rate 20, height 6\' 1"  (1.854 m), weight 99.8 kg, SpO2 100 %.   General - Well nourished, well developed, in no apparent distress.   Mental Status -  Level of arousal and orientation to time, place, and person were intact. Language including expression, naming, repetition, comprehension was assessed and found intact.   Cranial Nerves II - XII - II - Visual field intact OU. III, IV, VI - Extraocular movements intact, mild right gaze preference. V - Facial sensation intact bilaterally. VII - mild left facial weakness. VIII - Hearing & vestibular intact bilaterally. X - Palate elevates symmetrically. XI - Chin turning & shoulder shrug intact bilaterally. XII - Tongue protrusion intact.   Motor Strength - The patient's strength was normal in right upper and lower extremities, however, left UE proximal 4/5, distal wrist extension 2/5 and finger grip 2/5. Left LE 4+/5 proximal and  4-/5 ankle PF/DF.  Bulk was normal and fasciculations were absent.   Motor Tone - Muscle tone was assessed at the neck and appendages and was normal.   Reflexes - The patient's reflexes were symmetrical in all extremities and he had no pathological reflexes.   Sensory -reduced to pinprick, light touch and pain on the left side.   Coordination - The patient had normal movements in the hands with no ataxia or dysmetria.  Tremor was absent.   Gait and Station - deferred. Discharge Diet      Diet   Diet Heart Room service appropriate? Yes; Fluid consistency: Thin   liquids  DISCHARGE PLAN Disposition:  Home with outpatient therapies Ongoing stroke risk factor control by Primary Care Physician at time of discharge Follow-up PCP. Follow up in 2 weeks.  Follow-up in [facility] in 4 weeks, office to schedule an appointment.   Discharge Instructions     Ambulatory referral to Neurology   Complete by: As directed    An appointment is requested in approximately: 6 weeks   Ambulatory referral to Occupational Therapy   Complete by: As directed    Ambulatory referral to Physical Therapy   Complete by: As directed    Ambulatory referral to Speech Therapy   Complete by: As directed       35 minutes were spent preparing discharge.  I have personally obtained history,examined this patient, reviewed notes, independently viewed imaging studies,  participated in medical decision making and plan of care.ROS completed by me personally and pertinent positives fully documented  I have made any additions or clarifications directly to the above note. Agree with note above.   Antony Contras, MD Medical Director [facility] Pager: [phone] 01/03/2021 4:34 PM

## 2021-01-03 NOTE — Interval H&P Note (Signed)
History and Physical Interval Note:  01/03/2021 9:00 AM  Antonio Rogers  has presented today for surgery, with the diagnosis of STROKE.  The various methods of treatment have been discussed with the patient and family. After consideration of risks, benefits and other options for treatment, the patient has consented to  Procedure(s): TRANSESOPHAGEAL ECHOCARDIOGRAM (TEE) (N/A) as a surgical intervention.  The patient's history has been reviewed, patient examined, no change in status, stable for surgery.  I have reviewed the patient's chart and labs.  Questions were answered to the patient's satisfaction.     Anora Schwenke Harrell Gave

## 2021-01-03 NOTE — Progress Notes (Signed)
Inpatient Rehab Admissions Coordinator:   Pt. Ambulating at supervision level and is mod I with transfers this AM. PT. Has changed recommendations to outpatient. Pt. No longer demonstrates necessity for CIR level therapies, so CIR will sign off. I have notified pt. And Dr. Leonie Man and they are in agreement.   Clemens Catholic, [place], [place] Admissions Coordinator  [phone] ([place]) [phone] (office)

## 2021-01-03 NOTE — Progress Notes (Signed)
  Echocardiogram Echocardiogram Transesophageal has been performed.  Antonio Rogers 01/03/2021, 10:32 AM

## 2021-01-03 NOTE — Progress Notes (Signed)
Occupational Therapy Treatment Patient Details Name: Antonio Rogers MRN: [id number] DOB: [birth year] Today's Date: 01/03/2021    History of present illness 35 year old with a history of remote cocaine abuse admitted with right-sided watershed infarcts, right carotid artery thrombus , status post thrombectomy by neuro IR on 12/29/2020.   OT comments  Returned for second visit to establish HEP for LUE coordination and strength. Providing handouts on hand/wrist AROM, FM coordination, and theraputty. Pt demonstrating understanding. Continues to present with decreased pinch strength, grasp strength, FM skills, and motor planning. Continue to recommend follow up OT and will continue to follow acutely as admitted.     Follow Up Recommendations  Outpatient OT;Supervision/Assistance - 24 hour;CIR (Neuro OP. Contnue to recommend CIR for intensive OT to address cognition and LUE. however, declined CIR and will require OP OT.)    Equipment Recommendations  3 in 1 bedside commode    Recommendations for Other Services Rehab consult;PT consult;Speech consult    Precautions / Restrictions Precautions Precautions: Fall Precaution Comments: L inattention       Mobility Bed Mobility Overal bed mobility: Needs Assistance Bed Mobility: Supine to Sit;Sit to Supine Rolling: Supervision   Supine to sit: Supervision Sit to supine: Supervision   General bed mobility comments: Supervision for safety  Transfers Overall transfer level: Needs assistance   Transfers: Sit to/from Stand Sit to Stand: Supervision         General transfer comment: Supervision for safety    Balance Overall balance assessment: Needs assistance Sitting-balance support: No upper extremity supported;Feet supported Sitting balance-Leahy Scale: Good     Standing balance support: No upper extremity supported;During functional activity Standing balance-Leahy Scale: Good                             ADL  either performed or assessed with clinical judgement   ADL Overall ADL's : Needs assistance/impaired Eating/Feeding: Minimal assistance;Sitting Eating/Feeding Details (indicate cue type and reason): Requiring assistance for bilateral coordination and cutting food. Pt attempting to use RUE only and dropping food in his lap. Grooming: Wash/dry hands;Supervision/safety;Standing Grooming Details (indicate cue type and reason): Pt performing hand hygiene at sink with supervision. Pt presenting with poor bilateral coorindation and using RUE to manage LUE during hand hygiene. Educating pt on weight bearing techniques for neuro recovery.             Lower Body Dressing: Minimal assistance;Sit to/from stand;Cueing for sequencing;Cueing for compensatory techniques Lower Body Dressing Details (indicate cue type and reason): Pt requiring Mod cues to sequence donning of underwear. Pt attempting to don underwear but putting them on backards. Poor error recognition and requiring cues to restart. Pt then attempting to put underwear on inside out and then backwards. Requiring Min A for correctly target and put LLE into left pant leg. Toilet Transfer: Supervision/safety;Ambulation           Functional mobility during ADLs: Supervision/safety General ADL Comments: Focused session on exercises for LUE     Vision   Alignment/Gaze Preference: Gaze right Additional Comments: Continues to present with tendency for right gaze and inattention to left   Perception     Praxis      Cognition Arousal/Alertness: Suspect due to medications Behavior During Therapy: WFL for tasks assessed/performed Overall Cognitive Status: Impaired/Different from baseline Area of Impairment: Problem solving;Awareness;Following commands  Following Commands: Follows one step commands with increased time;Follows multi-step commands inconsistently Safety/Judgement: Decreased awareness of deficits  (L inattention) Awareness: Intellectual;Emergent (Poor error recognition) Problem Solving: Difficulty sequencing;Requires verbal cues General Comments: Continues to present with decreased memory, problem solving and sequencing.        Exercises Exercises: Hand exercises;Other exercises Hand Exercises Forearm Supination: AROM;Left;5 reps;Seated Forearm Pronation: AROM;Left;5 reps;Seated Wrist Flexion: AROM;Left;5 reps;Seated Wrist Extension: AROM;Left;5 reps;Seated Digit Composite Flexion: AROM;Left;5 reps;Seated Composite Extension: AROM;Left;5 reps;Seated Digit Lifts: AROM;Left;5 reps (very limited) Thumb Abduction:  (unable) Opposition:  (unable) Other Exercises Other Exercises: Providing pt with handout with pictures of composite grasp, wrist flex/ext, supin/pron, and elbow flex/exten; x10; LUE Other Exercises: Providing education on FM coorindation activities; pt performing composite flexion/extension, digits lifts, and attempting finger opposition Other Exercises: Providing handout on theraputty and pt performing sequence   Shoulder Instructions       General Comments Mother present throughout session    Pertinent Vitals/ Pain       Pain Assessment: No/denies pain  Home Living                                          Prior Functioning/Environment              Frequency  Min 2X/week        Progress Toward Goals  OT Goals(current goals can now be found in the care plan section)  Progress towards OT goals: Progressing toward goals  Acute Rehab OT Goals Patient Stated Goal: to return to independent mobility OT Goal Formulation: With patient Time For Goal Achievement: 01/13/21 Potential to Achieve Goals: Good ADL Goals Pt Will Perform Grooming: with min guard assist;sitting Pt Will Perform Upper Body Bathing: with min guard assist;sitting Pt Will Transfer to Toilet: with min guard assist;ambulating;bedside commode Pt/caregiver will  Perform Home Exercise Program: Left upper extremity;Increased strength;With theraputty;With Supervision;With written HEP provided Additional ADL Goal #1: Pt will complete complete bed mobility supervision level as precursor to adls  Plan Discharge plan remains appropriate    Co-evaluation                 AM-PAC OT "6 Clicks" Daily Activity     Outcome Measure   Help from another person eating meals?: A Little Help from another person taking care of personal grooming?: A Little Help from another person toileting, which includes using toliet, bedpan, or urinal?: A Little Help from another person bathing (including washing, rinsing, drying)?: A Little Help from another person to put on and taking off regular upper body clothing?: A Little Help from another person to put on and taking off regular lower body clothing?: A Lot 6 Click Score: 17    End of Session    OT Visit Diagnosis: Unsteadiness on feet (R26.81);Muscle weakness (generalized) (M62.81)   Activity Tolerance Patient tolerated treatment well   Patient Left in bed;with call bell/phone within reach;with family/visitor present   Nurse Communication Mobility status        Time: 4270-6237 OT Time Calculation (min): 22 min  Charges: OT General Charges $OT Visit: 1 Visit OT Treatments $Therapeutic Activity: 8-22 mins  [place], OTR/L Acute Rehab Pager: [phone] Office: [place] 01/03/2021, 4:27 PM

## 2021-01-03 NOTE — Progress Notes (Signed)
Reviewed discharge information with patient and patient's mother in room.  Understands all follow-up appointments, where to pick up prescriptions, and all changes in medications.  Both verbalize understanding of all instructions.  He has all belongings with him including phone, Games developer and clothing.

## 2021-01-03 NOTE — Progress Notes (Signed)
Physical Therapy Treatment Patient Details Name: Antonio Rogers MRN: [id number] DOB: 06-04-1986 Today's Date: 01/03/2021    History of Present Illness 35 year old with a history of remote cocaine abuse admitted with right-sided watershed infarcts, right carotid artery thrombus , status post thrombectomy by neuro IR on 12/29/2020.    PT Comments    Pt continues to progress with ambulation and transfers. Pt with improved awareness of L inattention, but continues to require S. Pt with decreased velocity and balance deficits. Pt had just returned from procedure and was requiring meds from RN, limiting session. Pt will continue to benefit from skilled PT to progress balance, strength, coordination and gait to maximize independence with functional mobility prior to discharge.     Follow Up Recommendations  Outpatient PT;Supervision/Assistance - 24 hour     Equipment Recommendations  None recommended by PT    Recommendations for Other Services       Precautions / Restrictions Precautions Precautions: Fall Precaution Comments: L inattention Restrictions Weight Bearing Restrictions: No    Mobility  Bed Mobility Overal bed mobility: Modified Independent                Transfers Overall transfer level: Modified independent   Transfers: Sit to/from Stand              Ambulation/Gait Ambulation/Gait assistance: Supervision Gait Distance (Feet): 250 Feet Assistive device: None Gait Pattern/deviations: Step-through pattern Gait velocity: reduced   General Gait Details: slowed, step-through gait but no LOB at this time. improved symmetry of stride length, pt wtih improved attention to L and able avoid obstacles   Stairs             Wheelchair Mobility    Modified Rankin (Stroke Patients Only) Modified Rankin (Stroke Patients Only) Pre-Morbid Rankin Score: No symptoms Modified Rankin: Moderately severe disability     Balance Overall balance  assessment: Mild deficits observed, not formally tested                                          Cognition Arousal/Alertness: Suspect due to medications;Lethargic Behavior During Therapy: WFL for tasks assessed/performed                                          Exercises      General Comments General comments (skin integrity, edema, etc.): VSS on RA; increased time spent educating on HEP for UE movement and balance. discussed d/c plans to outpatient, pt verbalized understanding and stated he will have someone to drive him      Pertinent Vitals/Pain Pain Assessment: No/denies pain    Home Living                      Prior Function            PT Goals (current goals can now be found in the care plan section) Acute Rehab PT Goals Patient Stated Goal: to return to independent mobility PT Goal Formulation: With patient Time For Goal Achievement: 01/13/21 Potential to Achieve Goals: Good Additional Goals Additional Goal #1: Pt will be able to verbalize his L neglect and attend to L side consistently when mobilizing, independently Progress towards PT goals: Progressing toward goals    Frequency    Min 4X/week  PT Plan Current plan remains appropriate    Co-evaluation              AM-PAC PT "6 Clicks" Mobility   Outcome Measure  Help needed turning from your back to your side while in a flat bed without using bedrails?: None Help needed moving from lying on your back to sitting on the side of a flat bed without using bedrails?: None Help needed moving to and from a bed to a chair (including a wheelchair)?: None Help needed standing up from a chair using your arms (e.g., wheelchair or bedside chair)?: None Help needed to walk in hospital room?: A Little Help needed climbing 3-5 steps with a railing? : A Little 6 Click Score: 22    End of Session Equipment Utilized During Treatment: Gait belt Activity Tolerance:  Patient tolerated treatment well Patient left: in bed;with call bell/phone within reach;with bed alarm set;with nursing/sitter in room Nurse Communication: Mobility status PT Visit Diagnosis: Other abnormalities of gait and mobility (R26.89);Other symptoms and signs involving the nervous system (R29.898);Hemiplegia and hemiparesis Hemiplegia - Right/Left: Left Hemiplegia - dominant/non-dominant: Non-dominant Hemiplegia - caused by: Cerebral infarction     Time: 1131-1143 PT Time Calculation (min) (ACUTE ONLY): 12 min  Charges:  $Gait Training: 8-22 mins                     Lyanne Co, DPT Acute Rehabilitation Services [phone]   [place] 01/03/2021, 11:55 AM

## 2021-01-03 NOTE — CV Procedure (Addendum)
    TRANSESOPHAGEAL ECHOCARDIOGRAM   NAME:  Antonio Rogers   MRN: [id number] DOB:  1986-09-09   ADMIT DATE: 12/29/2020  INDICATIONS: CVA  PROCEDURE:   Informed consent was obtained prior to the procedure. The risks, benefits and alternatives for the procedure were discussed and the patient comprehended these risks.  Risks include, but are not limited to, cough, sore throat, vomiting, nausea, somnolence, esophageal and stomach trauma or perforation, bleeding, low blood pressure, aspiration, pneumonia, infection, trauma to the teeth and death.    Procedural time out performed.   Patient received monitored anesthesia care under the supervision of Dr. Daiva Huge. Patient received a total of 60 mg lidocaine and 424 mg propofol during the procedure.  The transesophageal probe was inserted in the esophagus and stomach without difficulty and multiple views were obtained.    COMPLICATIONS:    There were no immediate complications.  FINDINGS:  LEFT VENTRICLE: EF = 55-60%. No regional wall motion abnormalities.  RIGHT VENTRICLE: Normal size and function.   LEFT ATRIUM: No thrombus/mass.  LEFT ATRIAL APPENDAGE: No thrombus/mass.   RIGHT ATRIUM: No thrombus/mass.  AORTIC VALVE:  Trileaflet. No regurgitation. No vegetation.  MITRAL VALVE:    Normal structure. Trivial regurgitation. No vegetation.  TRICUSPID VALVE: Normal structure. Trivvial regurgitation. No vegetation.  PULMONIC VALVE: Grossly normal structure. Trivial regurgitation. No apparent vegetation.  INTERATRIAL SEPTUM: Lipomatous intraatrial septum. No PFO or ASD seen by color Doppler. Agitated saline contrast shows late bubbles in left atrium, consistent with non-cardiac shunt.  PERICARDIUM: No effusion noted.  DESCENDING AORTA: No significant plaque seen   CONCLUSION: No intracardiac source of embolism identified. There is late positive bubble study consistent with non cardiac shunt.   Buford Dresser, MD,  PhD [facility]  [address] [place], [place] [address] [phone]   10:09 AM

## 2021-01-03 NOTE — TOC Transition Note (Signed)
Transition of Care [facility]) - CM/SW Discharge Note   Patient Details  Name: Antonio Rogers MRN: [id number] Date of Birth: Apr 12, 1986  [facility] CM/SW Contact:  Pollie Friar, RN Phone Number: 01/03/2021, 3:13 PM   Clinical Narrative:    Pt is discharging to his mothers home. Pt did better with therapy today and CIR has signed off.  Pts mother lives in [place], [place] and they prefer therapy in this area. CM has faxed orders to [place] therapy. Information for [facility] is on the AVS. Pt without a PCP. CM was able to obtain him an appt and placed it on AVS.  3 in 1 to be delivered to the room per [employer]. Mother to provide assistance at home and transportation to home.    Final next level of care: OP Rehab Barriers to Discharge: No Barriers Identified   Patient Goals and CMS Choice     Choice offered to / list presented to : [name]  Discharge Placement                       Discharge Plan and Services                DME Arranged: 3-N-1 DME Agency: [employer] Date DME Agency Contacted: 01/03/21   Representative spoke with at DME Agency: [place]            Social Determinants of Health ([place]) Interventions     Readmission Risk Interventions No flowsheet data found.

## 2021-01-04 ENCOUNTER — Encounter (HOSPITAL_COMMUNITY): Payer: Self-pay | Admitting: Cardiology

## 2021-01-04 LAB — FACTOR 5 LEIDEN

## 2021-01-13 NOTE — Telephone Encounter (Signed)
Action completed during call

## 2021-01-19 ENCOUNTER — Other Ambulatory Visit: Payer: Self-pay | Admitting: *Deleted

## 2021-01-19 NOTE — Patient Outreach (Signed)
[facility]) Care Management  01/19/2021  Antonio Rogers [birth year] [id number]   RED ON EMMI ALERT - Stroke Day # 13 Date: 2/16 Red Alert Reason: Not had follow up appointment   Outreach attempt #1, unsuccessful, HIPAA compliant voice message left.   Plan: RN CM will send outreach letter and follow up within the next 3-4 business days.  Antonio Rogers, [place], MSN [place] [phone]

## 2021-01-24 ENCOUNTER — Other Ambulatory Visit: Payer: Self-pay | Admitting: *Deleted

## 2021-01-24 NOTE — Patient Outreach (Signed)
[facility]) Care Management  01/24/2021  Antonio Rogers September 30, 1986 [id number]   RED ON EMMI ALERT - Stroke Day # 13 Date: 2/16 Red Alert Reason: Not had follow up appointment   Outreach attempt #2, unsuccessful, HIPAA compliant voice message left.   Plan: RN CM will follow up within the next 3-4 business days.  Valente David, [place], MSN [place] [phone]

## 2021-01-25 ENCOUNTER — Other Ambulatory Visit: Payer: Self-pay | Admitting: *Deleted

## 2021-01-25 NOTE — Patient Outreach (Addendum)
[facility]) Care Management  01/25/2021  Antonio Rogers 12-May-1986 [id number]   RED ON EMMI ALERT- Stroke Day #13 Date:2/16 Red Alert Reason:Not had follow up appointment  Voice message received from member after missed call yesterday.  Call placed back to member, no answer, HIPAA compliant voice message left.  Will await call back, if no call back will follow up within the next 3-4 business days as planned.    Update:  Call placed back to member after a second voice message received.  Identity verified.  This care manager introduced self and stated purpose of call.  [facility] care management services explained.  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  He will be following up with GI, ID, and general surgery coming on on 3/1 & 3/8 for interventions and recovery.  Also report he has appointments with PCP on 3/2 and Neuro on 4/6.  Parents will provide transportation.  He is active with home health for PT, OT, and speech.  Denies any further needs at this time but will contact this care manager if needs should change.     Antonio Rogers, [place], MSN [place] [phone]

## 2021-01-27 ENCOUNTER — Ambulatory Visit: Payer: Self-pay | Admitting: *Deleted

## 2021-02-08 ENCOUNTER — Telehealth: Payer: Self-pay | Admitting: Emergency Medicine

## 2021-02-08 NOTE — Telephone Encounter (Signed)
Faxed POC for OT/PT/SLP  OK transmission received

## 2021-03-08 ENCOUNTER — Telehealth: Payer: Self-pay | Admitting: Neurology

## 2021-03-08 ENCOUNTER — Other Ambulatory Visit: Payer: Self-pay | Admitting: Neurology

## 2021-03-08 ENCOUNTER — Encounter: Payer: Self-pay | Admitting: Neurology

## 2021-03-08 ENCOUNTER — Ambulatory Visit (INDEPENDENT_AMBULATORY_CARE_PROVIDER_SITE_OTHER): Payer: BC Managed Care – PPO | Admitting: Neurology

## 2021-03-08 VITALS — BP 125/85 | HR 73 | Ht 73.0 in | Wt 192.4 lb

## 2021-03-08 DIAGNOSIS — I63031 Cerebral infarction due to thrombosis of right carotid artery: Secondary | ICD-10-CM

## 2021-03-08 DIAGNOSIS — R7989 Other specified abnormal findings of blood chemistry: Secondary | ICD-10-CM | POA: Diagnosis not present

## 2021-03-08 DIAGNOSIS — E7211 Homocystinuria: Secondary | ICD-10-CM

## 2021-03-08 DIAGNOSIS — R29898 Other symptoms and signs involving the musculoskeletal system: Secondary | ICD-10-CM | POA: Diagnosis not present

## 2021-03-08 MED ORDER — FOLIC ACID 0.5 MG HALF TAB: 1.0000 mg | ORAL_TABLET | Freq: Every day | ORAL | Status: AC

## 2021-03-08 MED ORDER — CEREFOLIN 6-1-50-5 MG PO TABS: 1.0000 | ORAL_TABLET | Freq: Every day | ORAL | 3 refills | Status: DC

## 2021-03-08 MED ORDER — CEREFOLIN NAC 6-2-600 MG PO TABS: 1.0000 | ORAL_TABLET | Freq: Every day | ORAL | 3 refills | Status: DC

## 2021-03-08 NOTE — Telephone Encounter (Signed)
[employer] Josem Kaufmann: [phone] (exp. 03/08/21 to 04/06/21) order sent to GI. They will reach out to the patient to schedule.

## 2021-03-08 NOTE — Patient Instructions (Addendum)
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  Recommend checking repeat homocystine level today and MTHFR mutation for homocystine as well as starting Cerefolin NAC 1 tablet daily.  Recommend he continue to not smoke or use marijuana.  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  Check CT angiogram of the chest to look for pulmonary AV fistula is noted on TEE with some delayed bubbles.  He will return for follow-up in the future in 2 months or call earlier if necessary.  Stroke Prevention Some medical conditions and behaviors are associated with a higher chance of having a stroke. You can help prevent a stroke by making nutrition, lifestyle, and other changes, including managing any medical conditions you may have. What nutrition changes can be made?  Eat healthy foods. You can do this by: ? Choosing foods high in fiber, such as fresh fruits and vegetables and whole grains. ? Eating at least 5 or more servings of fruits and vegetables a day. Try to fill half of your plate at each meal with fruits and vegetables. ? Choosing lean protein foods, such as lean cuts of meat, poultry without skin, fish, tofu, beans, and nuts. ? Eating low-fat dairy products. ? Avoiding foods that are high in salt (sodium). This can help lower blood pressure. ? Avoiding foods that have saturated fat, trans fat, and cholesterol. This can help prevent high cholesterol. ? Avoiding processed and premade foods.  Follow your health care provider's specific guidelines for losing weight,  controlling high blood pressure (hypertension), lowering high cholesterol, and managing diabetes. These may include: ? Reducing your daily calorie intake. ? Limiting your daily sodium intake to 1,500 milligrams (mg). ? Using only healthy fats for cooking, such as olive oil, canola oil, or sunflower oil. ? Counting your daily carbohydrate intake.   What lifestyle changes can be made?  Maintain a healthy weight. Talk to your health care provider about your ideal weight.  Get at least 30 minutes of moderate physical activity at least 5 days a week. Moderate activity includes brisk walking, biking, and swimming.  Do not use any products that contain nicotine or tobacco, such as cigarettes and e-cigarettes. If you need help quitting, ask your health care provider. It may also be helpful to avoid exposure to secondhand smoke.  Limit alcohol intake to no more than 1 drink a day for nonpregnant women and 2 drinks a day for men. One drink equals 12 oz of beer, 5 oz of wine, or 1 oz of hard liquor.  Stop any illegal drug use.  Avoid taking birth control pills. Talk to your health care provider about the risks of taking birth control pills if: ? You are over 72 years old. ? You smoke. ? You get migraines. ? You have ever had a blood clot. What other changes can be made?  Manage your cholesterol levels. ? Eating a healthy diet is important for preventing high cholesterol. If cholesterol cannot be managed through diet alone, you may also need to take medicines. ? Take any prescribed medicines to control your cholesterol as told by your health care provider.  Manage your diabetes. ?  Eating a healthy diet and exercising regularly are important parts of managing your blood sugar. If your blood sugar cannot be managed through diet and exercise, you may need to take medicines. ? Take any prescribed medicines to control your diabetes as told by your health care provider.  Control your  hypertension. ? To reduce your risk of stroke, try to keep your blood pressure below 130/80. ? Eating a healthy diet and exercising regularly are an important part of controlling your blood pressure. If your blood pressure cannot be managed through diet and exercise, you may need to take medicines. ? Take any prescribed medicines to control hypertension as told by your health care provider. ? Ask your health care provider if you should monitor your blood pressure at home. ? Have your blood pressure checked every year, even if your blood pressure is normal. Blood pressure increases with age and some medical conditions.  Get evaluated for sleep disorders (sleep apnea). Talk to your health care provider about getting a sleep evaluation if you snore a lot or have excessive sleepiness.  Take over-the-counter and prescription medicines only as told by your health care provider. Aspirin or blood thinners (antiplatelets or anticoagulants) may be recommended to reduce your risk of forming blood clots that can lead to stroke.  Make sure that any other medical conditions you have, such as atrial fibrillation or atherosclerosis, are managed. What are the warning signs of a stroke? The warning signs of a stroke can be easily remembered as BEFAST.  B is for balance. Signs include: ? Dizziness. ? Loss of balance or coordination. ? Sudden trouble walking.  E is for eyes. Signs include: ? A sudden change in vision. ? Trouble seeing.  F is for face. Signs include: ? Sudden weakness or numbness of the face. ? The face or eyelid drooping to one side.  A is for arms. Signs include: ? Sudden weakness or numbness of the arm, usually on one side of the body.  S is for speech. Signs include: ? Trouble speaking (aphasia). ? Trouble understanding.  T is for time. ? These symptoms may represent a serious problem that is an emergency. Do not wait to see if the symptoms will go away. Get medical help right  away. Call your local emergency services (911 in the [place] Do not drive yourself to the hospital.  Other signs of stroke may include: ? A sudden, severe headache with no known cause. ? Nausea or vomiting. ? Seizure. Where to find more information For more information, visit:  American Stroke Association: www.strokeassociation.org  National Stroke Association: www.stroke.org Summary  You can prevent a stroke by eating healthy, exercising, not smoking, limiting alcohol intake, and managing any medical conditions you may have.  Do not use any products that contain nicotine or tobacco, such as cigarettes and e-cigarettes. If you need help quitting, ask your health care provider. It may also be helpful to avoid exposure to secondhand smoke.  Remember BEFAST for warning signs of stroke. Get help right away if you or a loved one has any of these signs. This information is not intended to replace advice given to you by your health care provider. Make sure you discuss any questions you have with your health care provider. Document Revised: 11/01/2017 Document Reviewed: 12/25/2016 Elsevier Patient Education  2021 [employer].

## 2021-03-08 NOTE — Progress Notes (Signed)
[facility] [address] [place]. [place] [address] [phone]       OFFICE FOLLOW-UP NOTE  Mr. Dontrel Smethers Date of Birth:  [birth year] Medical Record Number:  [id number]   HPI: Mr. Mcginley is a pleasant 35 year old Caucasian male seen today for initial office follow-up visit following hospital admission for stroke.  History is obtained from the patient and review of electronic medical records as well as care everywhere and I personally reviewed pertinent imaging films in PACS.  He has past medical history of hypertension, substance abuse, smoking, alcohol abuse who was admitted to [facility] on 12/29/2020 when his parents noted that he had difficult time texting and getting out of bed.  Patient is to live alone so exact last seen normal is not clear.  When EMS brought him to the ED he was a code stroke was not activated since patient was outside the time window for TPA.  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  Patient was taken for emergent mechanical thrombectomy which was successfully performed by Dr. Estanislado Pandy but no underlying dissection was noted.  Patient was kept in the intensive care unit and made rapid improvement only mild left hand weakness the time of discharge.  He had extensive stroke work-up including 2D echo which showed normal ejection fraction.  Urine drug screen was negative.  LDL cholesterol 50 mg percent.  Hemoglobin A1c was 5.5.  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  He had been a smoker and he quit smoking since his stroke.  He also quit alcohol and marijuana use as well.  Interestingly 2 weeks after his stroke patient her  developed severe abdominal pain and was admitted to [facility] in [place] with necrotizing pancreatitis.  CT scan of the abdomen and pelvis showed a complex heterogeneous mass with necrotic material in the form of cystic lesion in the lesser sac.  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  Cystogastrostomy was performed and a pancreatic duct stent was placed.  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  He was started on anticoagulation with Lovenox and his aspirin and Plavix were discontinued.  Patient also had arm infection and is currently has a PICC line and is being treated with antibiotics.  He has had a total of 4 ERCPs so far.  Patient states interestingly his brother died of an MI at age 52.  He denies any prior history of deep vein thrombosis or pulmonary embolism.  He did have a lower extremity venous Doppler during the previous stroke admission which was negative.  He states from stroke standpoint is doing well he still has mild left grip weakness and diminished fine motor skills but he has finished speech therapy and physical therapy is on hold because of his abdominal surgeries and is continuing occupational therapy.  ROS:   14 system review of systems is positive for weakness, stroke, abdominal pain, pancreatitis and all other systems negative  PMH:  Past Medical History:  Diagnosis Date  . Anxiety   . Chicken pox   . CVA (cerebral vascular accident) ([place])   . Depression   . Drug  abuse and dependence ([place])    cocaine   . Hypertension    high blood pressure readings  . Pancreatitis     Social History:  Social History   Socioeconomic History  . Marital status: Legally Separated    Spouse name: Not on file  . Number of children: 0  . Years of education: college  . Highest education level: Bachelor's degree (e.g., BA, AB, BS)  Occupational History  .  Occupation: Disability  Tobacco Use  . Smoking status: Former Smoker    Packs/day: 1.00    Types: Cigarettes  . Smokeless tobacco: Never Used  . Tobacco comment: No smoking since 12/29/20.  Substance and Sexual Activity  . Alcohol use: Yes    Alcohol/week: 0.0 standard drinks    Comment: No drinking since 12/29/20.  . Drug use: Not Currently    Comment: No drug use since 12/29/20.  Marland Kitchen Sexual activity: Not on file  Other Topics Concern  . Not on file  Social History Narrative   Lives at home alone.   Right-handed.   No daily caffeine use.   Social Determinants of Health   Financial Resource Strain: Not on file  Food Insecurity: Not on file  Transportation Needs: Not on file  Physical Activity: Not on file  Stress: Not on file  Social Connections: Not on file  Intimate Partner Violence: Not on file    Medications:   Current Outpatient Medications on File Prior to Visit  Medication Sig Dispense Refill  . atorvastatin (LIPITOR) 20 MG tablet Take 1 tablet (20 mg total) by mouth daily. 30 tablet 1  . cholecalciferol (VITAMIN D) 25 MCG tablet Take 1 tablet (1,000 Units total) by mouth daily. 30 tablet 0  . enoxaparin (LOVENOX) 100 MG/ML injection Inject 100 mg into the skin 2 (two) times daily.    . ferrous sulfate 325 (65 FE) MG tablet Take 325 mg by mouth daily with breakfast.    . hydrOXYzine (ATARAX/VISTARIL) 50 MG tablet Take 100 mg by mouth daily.  2  . meropenem (MERREM) IVPB Inject 1 g into the vein every 8 (eight) hours.    . ondansetron (ZOFRAN-ODT) 4 MG disintegrating tablet Take 4 mg by mouth every 8 (eight) hours as needed for nausea or vomiting.    . pantoprazole (PROTONIX) 40 MG tablet Take 40 mg by mouth daily.    . traMADol (ULTRAM) 50 MG tablet Take 50 mg by mouth every 6 (six) hours as needed.    . traZODone (DESYREL) 100 MG tablet Take 200 mg by mouth at bedtime.    Marland Kitchen venlafaxine XR (EFFEXOR-XR) 75 MG 24 hr capsule Take 150 mg by mouth daily.    . vitamin C  (ASCORBIC ACID) 500 MG tablet Take 500 mg by mouth daily.     No current facility-administered medications on file prior to visit.    Allergies:   Allergies  Allergen Reactions  . Penicillins Anaphylaxis    Physical Exam General: well developed, well nourished young Caucasian male, seated, in no evident distress Head: head normocephalic and atraumatic.  Neck: supple with no carotid or supraclavicular bruits Cardiovascular: regular rate and rhythm, no murmurs Musculoskeletal: no deformity Skin:  no rash/petichiae. right forearm PICC line Vascular:  Normal pulses all extremities Abdomen distended. Vitals:   03/08/21 0825  BP: 125/85  Pulse: 73   Neurologic Exam Mental Status: Awake and fully alert. Oriented to place and time. Recent and remote memory intact. Attention span, concentration and fund of knowledge appropriate.  Mood and affect appropriate.  Cranial Nerves: Fundoscopic exam reveals sharp disc margins. Pupils equal, briskly reactive to light. Extraocular movements full without nystagmus. Visual fields full to confrontation. Hearing intact. Facial sensation intact. Face, tongue, palate moves normally and symmetrically.  Motor: Normal bulk and tone. Normal strength in all tested extremity muscles.  Diminished fine finger movements on the left.  Mild left grip weakness.  Orbits right over left upper extremity. Sensory.: intact to touch ,pinprick .position and vibratory sensation.  Coordination: Rapid alternating movements normal in all extremities. Finger-to-nose and heel-to-shin performed accurately bilaterally. Gait and Station: Arises from chair without difficulty. Stance is normal. Gait demonstrates normal stride length and balance . Able to heel, toe and tandem walk without difficulty.  Reflexes: 1+ and symmetric. Toes downgoing.   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  Recommend checking repeat homocystine level today and MTHFR mutation for homocystine as well as starting Cerefolin NAC 1 tablet daily.  Recommend he continue to not smoke or use marijuana.  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  Check CT angiogram of the chest to look for pulmonary AV fistula is noted on TEE with some delayed bubbles.  He will return for follow-up in the future in 2 months or call earlier if necessary. Greater than 50% of time during this prolongred  40 minute visit was spent on counseling,explanation of diagnosis, planning of further management, discussion with patient and family and coordination of care Antony Contras, MD Note: This document was prepared with digital dictation and possible smart phrase technology. Any transcriptional errors that result from this process are unintentional

## 2021-03-13 NOTE — Progress Notes (Signed)
Kindly inform the patient that homocystine blood level is now normal and having quit smoking has helped.  2 of the other blood tests reports are not back yet

## 2021-03-14 ENCOUNTER — Encounter: Payer: Self-pay | Admitting: *Deleted

## 2021-03-21 LAB — HOMOCYSTEINE: Homocysteine: 12 umol/L (ref 0.0–14.5)

## 2021-03-21 LAB — MTHFR DNA ANALYSIS

## 2021-03-21 LAB — VITAMIN B2, WHOLE BLOOD: Vitamin B2, Whole Blood: 201 ug/L (ref 137–370)

## 2021-03-22 ENCOUNTER — Ambulatory Visit
Admission: RE | Admit: 2021-03-22 | Discharge: 2021-03-22 | Disposition: A | Discharge status: Home / Self Care | Payer: BC Managed Care – PPO | Source: Ambulatory Visit | Attending: Neurology | Admitting: Neurology

## 2021-03-22 MED ORDER — IOPAMIDOL (ISOVUE-370) INJECTION 76%
75.0000 mL | Freq: Once | INTRAVENOUS | Status: AC | PRN
  Administered 2021-03-22: 75 mL via INTRAVENOUS

## 2021-03-23 ENCOUNTER — Telehealth: Payer: Self-pay | Admitting: Emergency Medicine

## 2021-03-23 ENCOUNTER — Other Ambulatory Visit: Payer: Self-pay | Admitting: Emergency Medicine

## 2021-03-23 DIAGNOSIS — K8689 Other specified diseases of pancreas: Secondary | ICD-10-CM

## 2021-03-23 NOTE — Telephone Encounter (Signed)
Patient returned call.  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 Koleen Nimrod with [facility] is doing surgery on it.    He is calling Dr [employer] office as well as [facility] to confirm test, scheduling and just make sure he needs to have these done.    Patient denied further questions, verbalized understanding and expressed appreciation for the phone call.

## 2021-03-23 NOTE — Telephone Encounter (Signed)
I called [employer] [phone]) to request authorization for CT of Abdomen/Pelvis W WO contrast (50539) for K86.89. Request approved, Josem Kaufmann #[id number] (03/23/21- 04/21/21). I called GI, patient has to be scheduled for this. They would like patient to arrive to the [place] location by 2:00 today.   I called patient and LVM on his cell with my direct line, our office number as well as [employer] number. I tried calling the work number on file but was informed he no longer works there.

## 2021-03-23 NOTE — Telephone Encounter (Signed)
LM for return call.  I am unable to reach patien'ts FMP on file, [employer].  The number on file continues to ring busy.  Searched google and found a 2nd number [phone] and that number rings as no longer in service.

## 2021-03-23 NOTE — Telephone Encounter (Signed)
Attempted to contact patient again. LVM stating that if he is unable to make the 2pm appointment, he will need to call GI at [phone] to reschedule. Advised opt. 1 and then opt. 4.

## 2021-03-23 NOTE — Telephone Encounter (Signed)
Thank you :)

## 2021-03-27 NOTE — Telephone Encounter (Signed)
Noted, thank you

## 2021-03-30 NOTE — Telephone Encounter (Signed)
Request made

## 2021-04-06 ENCOUNTER — Other Ambulatory Visit: Payer: Self-pay

## 2021-04-06 NOTE — Patient Outreach (Signed)
[place] [facility]) Care Management  04/06/2021  Antonio Rogers Jul 29, 1986 [id number]   First telephone outreach attempt to obtain mRS. No answer. Left message for returned call.  Philmore Pali [facility] Management Assistant [phone]

## 2021-04-07 ENCOUNTER — Other Ambulatory Visit: Payer: Self-pay | Admitting: Neurology

## 2021-04-07 DIAGNOSIS — C250 Malignant neoplasm of head of pancreas: Secondary | ICD-10-CM

## 2021-04-07 NOTE — Progress Notes (Signed)
Kindly inform the patient that CT angiogram study of the chest does not reveal any evidence of pulmonary AV fistula.  Partial visualization of the abdomen suggests an abnormality which needs further evaluation by checking a dedicated CT scan of the abdomen which I will order

## 2021-04-07 NOTE — Progress Notes (Signed)
Kindly inform the patient that the genetic blood test for elevated homocystine risk is back and he does not have increased risk

## 2021-04-10 ENCOUNTER — Encounter: Payer: Self-pay | Admitting: Neurology

## 2021-04-10 ENCOUNTER — Telehealth (HOSPITAL_COMMUNITY): Payer: Self-pay

## 2021-04-10 NOTE — Telephone Encounter (Signed)
Called to schedule cta head/neck, no answer, left vm. AW 

## 2021-04-11 ENCOUNTER — Telehealth: Payer: Self-pay | Admitting: Neurology

## 2021-04-11 NOTE — Telephone Encounter (Signed)
[employer] Josem Kaufmann: [id number] (exp. 03/23/21 to 04/21/21) order sent to GI. They will reach out to the patient to schedule.

## 2021-04-12 ENCOUNTER — Other Ambulatory Visit: Payer: Self-pay

## 2021-04-12 NOTE — Patient Outreach (Signed)
[place] [facility]) Care Management  04/12/2021  Xavien Dauphinais Mar 18, 1986 [id number]   Second telephone outreach attempt to obtain mRS. No answer. Left message for returned call.  Ina Homes [facility] Management Assistant [phone]

## 2021-04-17 ENCOUNTER — Other Ambulatory Visit: Payer: Self-pay

## 2021-04-17 NOTE — Patient Outreach (Signed)
[place]) Care Management  04/17/2021  Jerimiah Wolman [birth year] [id number]   Telephone outreach to patient to obtain mRS was successfully completed. MRS= 1   [facility] Care Management Assistant

## 2021-05-18 ENCOUNTER — Ambulatory Visit: Payer: BC Managed Care – PPO | Admitting: Neurology

## 2021-08-24 ENCOUNTER — Other Ambulatory Visit: Payer: Self-pay

## 2021-08-24 ENCOUNTER — Ambulatory Visit (INDEPENDENT_AMBULATORY_CARE_PROVIDER_SITE_OTHER): Payer: BC Managed Care – PPO | Admitting: Neurology

## 2021-08-24 VITALS — BP 130/70 | HR 68 | Ht 73.0 in | Wt 206.0 lb

## 2021-08-24 DIAGNOSIS — Z8673 Personal history of transient ischemic attack (TIA), and cerebral infarction without residual deficits: Secondary | ICD-10-CM | POA: Diagnosis not present

## 2021-08-24 DIAGNOSIS — I6990 Unspecified sequelae of unspecified cerebrovascular disease: Secondary | ICD-10-CM | POA: Diagnosis not present

## 2021-08-24 DIAGNOSIS — R208 Other disturbances of skin sensation: Secondary | ICD-10-CM | POA: Diagnosis not present

## 2021-08-24 MED ORDER — TOPIRAMATE 25 MG PO TABS: 25.0000 mg | ORAL_TABLET | Freq: Two times a day (BID) | ORAL | 3 refills | Status: DC

## 2021-08-24 NOTE — Patient Instructions (Signed)
I had a long discussion with the patient with his remote cryptogenic stroke and he seems to be doing quite well from that but does complain of some left upper extremity paresthesias and dysesthesias likely poststroke.  I recommend trial of Topamax 25 mg twice daily to be increased gradually as tolerated.  I also recommend he discontinue Cerefolin as his last homocystine level was normal and he has quit smoking which was likely responsible for it.  He will continue aspirin for stroke prevention and maintain aggressive risk factor modification with strict control of hypertension with blood pressure goal below 140/90, lipids with LDL cholesterol goal below 70 mg percent and diabetes with hemoglobin A1c goal below 6.5%.  He will continue follow-up at [facility] hepatology for his portal vein thrombosis and return for follow-up with me in 6 months and have follow-up homocystine level checked prior to that visit. Stroke Prevention Some medical conditions and behaviors are associated with a higher chance of having a stroke. You can help prevent a stroke by making nutrition, lifestyle, and other changes, including managing any medical conditions you may have. What nutrition changes can be made?  Eat healthy foods. You can do this by: Choosing foods high in fiber, such as fresh fruits and vegetables and whole grains. Eating at least 5 or more servings of fruits and vegetables a day. Try to fill half of your plate at each meal with fruits and vegetables. Choosing lean protein foods, such as lean cuts of meat, poultry without skin, fish, tofu, beans, and nuts. Eating low-fat dairy products. Avoiding foods that are high in salt (sodium). This can help lower blood pressure. Avoiding foods that have saturated fat, trans fat, and cholesterol. This can help prevent high cholesterol. Avoiding processed and premade foods. Follow your health care provider's specific guidelines for losing weight, controlling high blood pressure  (hypertension), lowering high cholesterol, and managing diabetes. These may include: Reducing your daily calorie intake. Limiting your daily sodium intake to 1,500 milligrams (mg). Using only healthy fats for cooking, such as olive oil, canola oil, or sunflower oil. Counting your daily carbohydrate intake. What lifestyle changes can be made? Maintain a healthy weight. Talk to your health care provider about your ideal weight. Get at least 30 minutes of moderate physical activity at least 5 days a week. Moderate activity includes brisk walking, biking, and swimming. Do not use any products that contain nicotine or tobacco, such as cigarettes and e-cigarettes. If you need help quitting, ask your health care provider. It may also be helpful to avoid exposure to secondhand smoke. Limit alcohol intake to no more than 1 drink a day for nonpregnant women and 2 drinks a day for men. One drink equals 12 oz of beer, 5 oz of wine, or 1 oz of hard liquor. Stop any illegal drug use. Avoid taking birth control pills. Talk to your health care provider about the risks of taking birth control pills if: You are over 31 years old. You smoke. You get migraines. You have ever had a blood clot. What other changes can be made? Manage your cholesterol levels. Eating a healthy diet is important for preventing high cholesterol. If cholesterol cannot be managed through diet alone, you may also need to take medicines. Take any prescribed medicines to control your cholesterol as told by your health care provider. Manage your diabetes. Eating a healthy diet and exercising regularly are important parts of managing your blood sugar. If your blood sugar cannot be managed through diet and exercise,  you may need to take medicines. Take any prescribed medicines to control your diabetes as told by your health care provider. Control your hypertension. To reduce your risk of stroke, try to keep your blood pressure below  130/80. Eating a healthy diet and exercising regularly are an important part of controlling your blood pressure. If your blood pressure cannot be managed through diet and exercise, you may need to take medicines. Take any prescribed medicines to control hypertension as told by your health care provider. Ask your health care provider if you should monitor your blood pressure at home. Have your blood pressure checked every year, even if your blood pressure is normal. Blood pressure increases with age and some medical conditions. Get evaluated for sleep disorders (sleep apnea). Talk to your health care provider about getting a sleep evaluation if you snore a lot or have excessive sleepiness. Take over-the-counter and prescription medicines only as told by your health care provider. Aspirin or blood thinners (antiplatelets or anticoagulants) may be recommended to reduce your risk of forming blood clots that can lead to stroke. Make sure that any other medical conditions you have, such as atrial fibrillation or atherosclerosis, are managed. What are the warning signs of a stroke? The warning signs of a stroke can be easily remembered as BEFAST. B is for balance. Signs include: Dizziness. Loss of balance or coordination. Sudden trouble walking. E is for eyes. Signs include: A sudden change in vision. Trouble seeing. F is for face. Signs include: Sudden weakness or numbness of the face. The face or eyelid drooping to one side. A is for arms. Signs include: Sudden weakness or numbness of the arm, usually on one side of the body. S is for speech. Signs include: Trouble speaking (aphasia). Trouble understanding. T is for time. These symptoms may represent a serious problem that is an emergency. Do not wait to see if the symptoms will go away. Get medical help right away. Call your local emergency services (911 in the [place] Do not drive yourself to the hospital. Other signs of stroke may include: A  sudden, severe headache with no known cause. Nausea or vomiting. Seizure. Where to find more information For more information, visit: American Stroke Association: www.strokeassociation.org National Stroke Association: www.stroke.org Summary You can prevent a stroke by eating healthy, exercising, not smoking, limiting alcohol intake, and managing any medical conditions you may have. Do not use any products that contain nicotine or tobacco, such as cigarettes and e-cigarettes. If you need help quitting, ask your health care provider. It may also be helpful to avoid exposure to secondhand smoke. Remember BEFAST for warning signs of stroke. Get help right away if you or a loved one has any of these signs. This information is not intended to replace advice given to you by your health care provider. Make sure you discuss any questions you have with your health care provider. Document Revised: 11/01/2017 Document Reviewed: 12/25/2016 Elsevier Patient Education  2021 [employer].

## 2021-08-24 NOTE — Progress Notes (Signed)
[facility] [address] [place]. [place] [address] [phone]       OFFICE FOLLOW-UP NOTE  [name] Date of Birth:  1986-05-02 Medical Record Number:  [id number]   HPI: Antonio Rogers is a pleasant 35 year old Caucasian male seen today for initial office follow-up visit following hospital admission for stroke.  History is obtained from the patient and review of electronic medical records as well as care everywhere and I personally reviewed pertinent imaging films in PACS.  He has past medical history of hypertension, substance abuse, smoking, alcohol abuse who was admitted to [facility] on 12/29/2020 when his parents noted that he had difficult time texting and getting out of bed.  Patient is to live alone so exact last seen normal is not clear.  When EMS brought him to the ED he was a code stroke was not activated since patient was outside the time window for TPA.  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  Patient was taken for emergent mechanical thrombectomy which was successfully performed by Dr. Estanislado Pandy but no underlying dissection was noted.  Patient was kept in the intensive care unit and made rapid improvement only mild left hand weakness the time of discharge.  He had extensive stroke work-up including 2D echo which showed normal ejection fraction.  Urine drug screen was negative.  LDL cholesterol 50 mg percent.  Hemoglobin A1c was 5.5.  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  He had been a smoker and he quit smoking since his stroke.  He also quit alcohol and marijuana use as well.  Interestingly 2 weeks after his stroke patient her  developed severe abdominal pain and was admitted to [facility] [place] with necrotizing pancreatitis.  CT scan of the abdomen and pelvis showed a complex heterogeneous mass with necrotic material in the form of cystic lesion in the lesser sac.  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  Cystogastrostomy was performed and a pancreatic duct stent was placed.  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  He was started on anticoagulation with Lovenox and his aspirin and Plavix were discontinued.  Patient also had arm infection and is currently has a PICC line and is being treated with antibiotics.  He has had a total of 4 ERCPs so far.  Patient states interestingly his brother died of an MI at age 20.  He denies any prior history of deep vein thrombosis or pulmonary embolism.  He did have a lower extremity venous Doppler during the previous stroke admission which was negative.  He states from stroke standpoint is doing well he still has mild left grip weakness and diminished fine motor skills but he has finished speech therapy and physical therapy is on hold because of his abdominal surgeries and is continuing occupational therapy. Update 08/24/2021 : He returns for follow-up after last visit 5 months ago.  Patient was started on Cerefolin for elevated homocystine and quit smoking and follow-up homocystine level came back normal at 12 at 03/08/2021 and MH TR DNA analysis for mutation was negative.  He remains on Cerefolin and is having trouble affording  it.  CT angiogram of the chest on 03/22/2021 showed no evidence of pulmonary AV fistula.  He was on Lovenox for his portal vein thrombosis but started develop some bleeding and it has been stopped a few months ago and is now on aspirin 81 mg daily.  CT abdomen with and without contrast on 07/03/2021 showed resolution of the cystic lesion in the body of the  pancreas and fluid collection in gallbladder representing resolving hematoma.  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  Patient has been referred to a hepatologist at [facility] and has an appointment next month.  He was seen by interventional radiologist at [facility] who felt he needed to go to a tertiary care center for further treatment.  Patient complains of left hand dysesthesias and burning which is intermittent.  He denies any lack of sensation.  He has not tried medications like gabapentin or Topamax for this.  He has been trying some tramadol which has not helped.  He has had no recurrent stroke or TIA symptoms. ROS:   14 system review of systems is positive for weakness, stroke, abdominal pain, pancreatitis and all other systems negative  PMH:  Past Medical History:  Diagnosis Date   Anxiety    Chicken pox    CVA (cerebral vascular accident) ([place])    Depression    Drug abuse and dependence ([place])    cocaine    Hypertension    high blood pressure readings   Pancreatitis     Social History:  Social History   Socioeconomic History   Marital status: Legally Separated    Spouse name: Not on file   Number of children: 0   Years of education: college   Highest education level: Bachelor's degree (e.g., BA, AB, BS)  Occupational History   Occupation: Disability  Tobacco Use   Smoking status: Former    Packs/day: 1.00    Types: Cigarettes   Smokeless tobacco: Never   Tobacco comments:    No smoking since 12/29/20.  Substance and Sexual Activity   Alcohol use: Yes    Alcohol/week: 0.0 standard drinks    Comment: No drinking since 12/29/20.   Drug use: Not Currently    Comment: No drug use since 12/29/20.   Sexual activity: Not on file  Other Topics Concern   Not on file  Social History Narrative   Lives at home alone.   Right-handed.   No daily caffeine use.   Social  Determinants of Health   Financial Resource Strain: Not on file  Food Insecurity: Not on file  Transportation Needs: Not on file  Physical Activity: Not on file  Stress: Not on file  Social Connections: Not on file  Intimate Partner Violence: Not on file    Medications:   Current Outpatient Medications on File Prior to Visit  Medication Sig Dispense Refill   aspirin 81 MG EC tablet Take by mouth.     atorvastatin (LIPITOR) 20 MG tablet Take 1 tablet (20 mg total) by mouth daily. 30 tablet 1   cholecalciferol (VITAMIN D) 25 MCG tablet Take 1 tablet (1,000 Units total) by mouth daily. 30 tablet 0   ferrous sulfate 325 (65 FE) MG tablet Take 325 mg by mouth daily with breakfast.     hydrOXYzine (ATARAX/VISTARIL) 50 MG tablet Take 100 mg by mouth daily.  2   meropenem (MERREM) IVPB Inject 1 g into the vein  every 8 (eight) hours.     ondansetron (ZOFRAN-ODT) 4 MG disintegrating tablet Take 4 mg by mouth every 8 (eight) hours as needed for nausea or vomiting.     pantoprazole (PROTONIX) 40 MG tablet Take 40 mg by mouth daily.     traMADol (ULTRAM) 50 MG tablet Take 50 mg by mouth every 6 (six) hours as needed.     traZODone (DESYREL) 100 MG tablet Take 200 mg by mouth at bedtime.     venlafaxine XR (EFFEXOR-XR) 75 MG 24 hr capsule Take 150 mg by mouth daily.     vitamin C (ASCORBIC ACID) 500 MG tablet Take 500 mg by mouth daily.     Current Facility-Administered Medications on File Prior to Visit  Medication Dose Route Frequency Provider Last Rate Last Admin   folic acid (FOLVITE) tablet 1 mg  1 mg Oral Daily Garvin Fila, MD        Allergies:   Allergies  Allergen Reactions   Penicillins Anaphylaxis    Physical Exam General: well developed, well nourished young Caucasian male, seated, in no evident distress Head: head normocephalic and atraumatic.  Neck: supple with no carotid or supraclavicular bruits Cardiovascular: regular rate and rhythm, no murmurs Musculoskeletal: no  deformity Skin:  no rash/petichiae. right forearm PICC line Vascular:  Normal pulses all extremities Abdomen distended. Vitals:   08/24/21 1354  BP: 130/70  Pulse: 68   Neurologic Exam Mental Status: Awake and fully alert. Oriented to place and time. Recent and remote memory intact. Attention span, concentration and fund of knowledge appropriate. Mood and affect appropriate.  Cranial Nerves: Fundoscopic exam reveals sharp disc margins. Pupils equal, briskly reactive to light. Extraocular movements full without nystagmus. Visual fields full to confrontation. Hearing intact. Facial sensation intact.  Subtle left lower facial asymmetry only when he smiles deep.  E tongue, palate moves normally and symmetrically.  Motor: Normal bulk and tone. Normal strength in all tested extremity muscles.  Diminished fine finger movements on the left.  Mild left grip weakness.  Orbits right over left upper extremity. Sensory.: intact to touch ,pinprick .position and vibratory sensation.  Subjective dysesthesias in left upper extremity but no objective sensory loss Coordination: Rapid alternating movements normal in all extremities. Finger-to-nose and heel-to-shin performed accurately bilaterally. Gait and Station: Arises from chair without difficulty. Stance is normal. Gait demonstrates normal stride length and balance . Able to heel, toe and tandem walk without difficulty.  Reflexes: 1+ and symmetric. Toes downgoing.     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  Etiology cryptogenic .vascular risk factors of elevated homocystine level related to smoking only.  Recent history of portal vein thrombosis and necrotizing pancreatitis.  He is doing well except for mild poststroke paresthesias.     PLAN: I had a long discussion with the patient with  his remote cryptogenic stroke and he seems to be doing quite well from that but does complain of some left upper extremity paresthesias and dysesthesias likely poststroke.  I recommend trial of Topamax 25 mg twice daily to be increased gradually as tolerated.  I also recommend he discontinue Cerefolin as his last homocystine level was normal and he has quit smoking which was likely responsible for it.  He will continue aspirin for stroke prevention and maintain aggressive risk factor modification with strict control of hypertension with blood pressure goal below 140/90, lipids with  LDL cholesterol goal below 70 mg percent and diabetes with hemoglobin A1c goal below 6.5%.  He will continue follow-up at [facility] hepatology for his portal vein thrombosis and return for follow-up with me in 6 months and have follow-up homocystine level checked prior to that visit. Greater than 50% of time during this prolongred  30 minute visit was spent on counseling,explanation of diagnosis of cryptogenic stroke, planning of further management, discussion with patient and family and coordination of care Antony Contras, MD Note: This document was prepared with digital dictation and possible smart phrase technology. Any transcriptional errors that result from this process are unintentional

## 2021-09-20 IMAGING — MR MR HEAD W/O CM
12 of 14 series · 37 of 48 positions shown · non-contrast
Comparison: Noncontrast head CT and CT angiogram head/neck
performed earlier today 12/29/2020.

CLINICAL DATA: Neuro deficit, acute, stroke suspected.

EXAM:
MRI HEAD WITHOUT CONTRAST
TECHNIQUE: Multiplanar, multiecho pulse sequences of the brain and surrounding
structures were obtained without intravenous contrast.

[Series 5: DWI · axial · 3.0mm · 0.88mm/px · z∈[-96,+57]mm · 5 of 104 slices shown (1 of 4)]
[im 1/104]
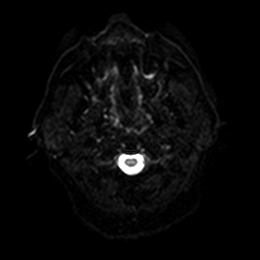
[im 26/104]
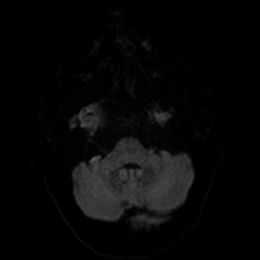
[im 52/104]
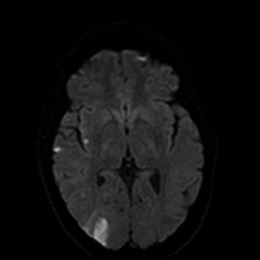
[im 78/104]
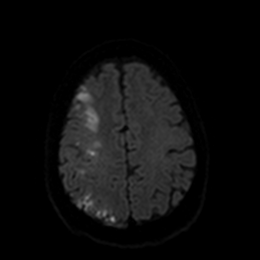
[im 104/104]
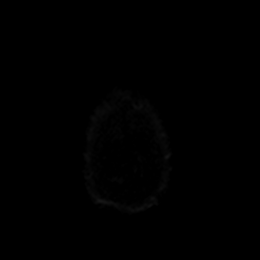

[Series 6: DWI · axial · 3.0mm · 0.88mm/px · z∈[-96,+57]mm · 3 of 51 slices shown (2 of 4)]
[im 1/51]
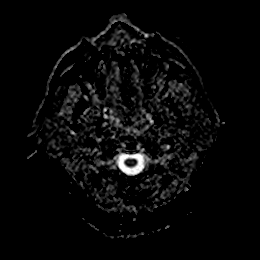
[im 26/51]
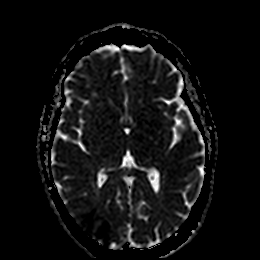
[im 51/51]
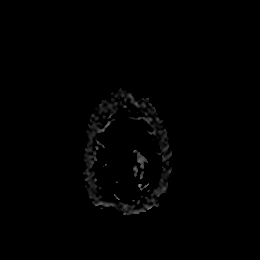

[Series 7: DWI · coronal · 4.0mm · 0.88mm/px · 4 of 70 slices shown (3 of 4)]
[im 1/70]
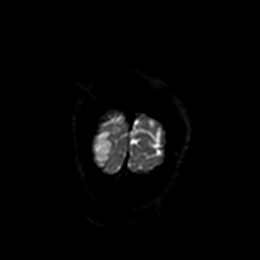
[im 24/70]
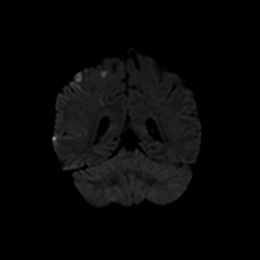
[im 47/70]
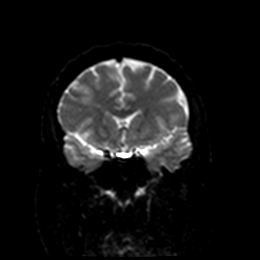
[im 70/70]
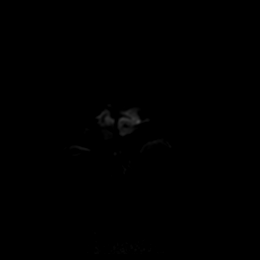

[Series 8: DWI · coronal · 4.0mm · 0.88mm/px · 2 of 35 slices shown (4 of 4)]
[im 1/35]
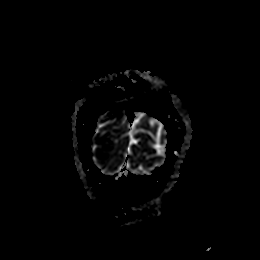
[im 35/35]
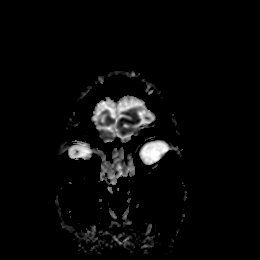

[Series 9: FLAIR · axial · 5.0mm · 0.45mm/px · z∈[-109,+53]mm · 2 of 28 slices shown]
[im 1/28]
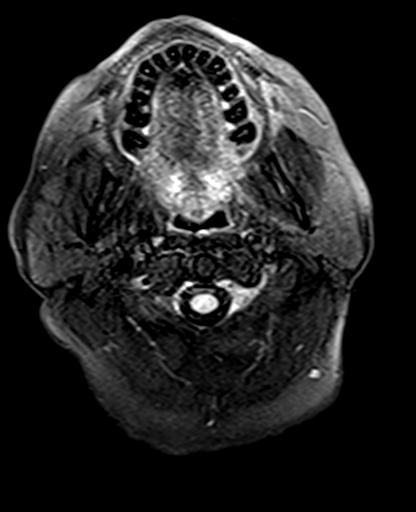
[im 28/28]
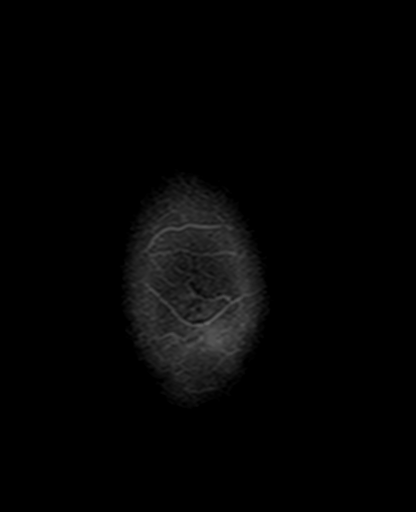

[Series 10: T2 · axial · 5.0mm · 0.72mm/px · z∈[-97,+64]mm · 2 of 28 slices shown (1 of 2)]
[im 1/28]
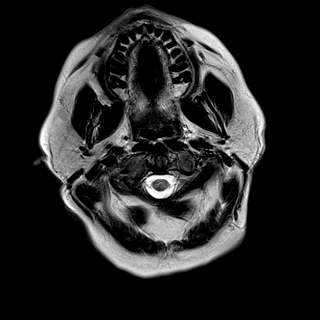
[im 28/28]
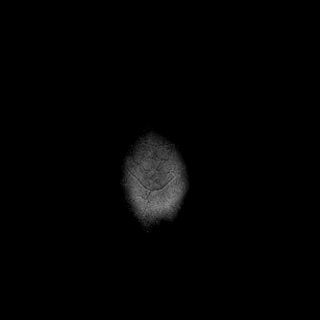

[Series 11: T1 · sagittal · 5.0mm · 0.75mm/px · 2 of 25 slices shown]
[im 1/25]
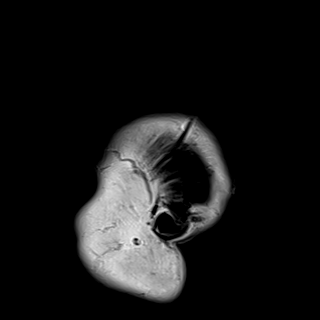
[im 25/25]
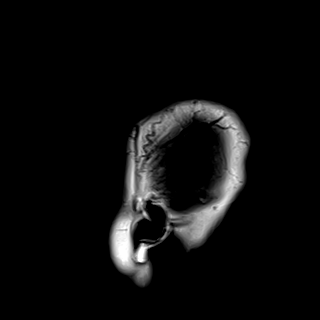

[Series 12: mag_images · axial · 3.0mm · 0.90mm/px · z∈[-106,+70]mm · 4 of 60 slices shown]
[im 1/60]
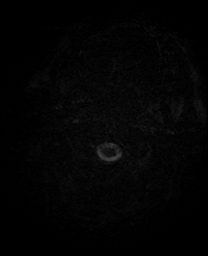
[im 20/60]
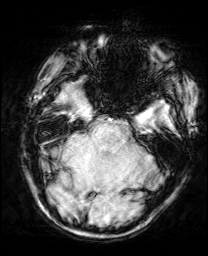
[im 40/60]
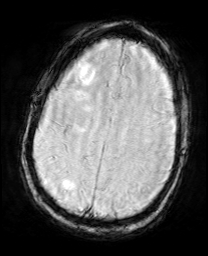
[im 60/60]
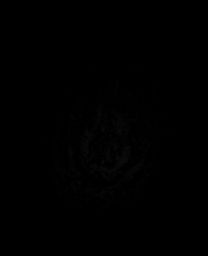

[Series 13: pha_images · axial · 3.0mm · 0.90mm/px · z∈[-106,+70]mm · 4 of 60 slices shown]
[im 1/60]
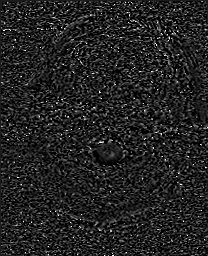
[im 20/60]
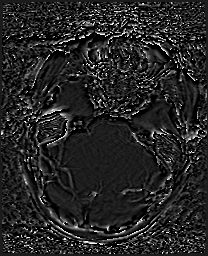
[im 40/60]
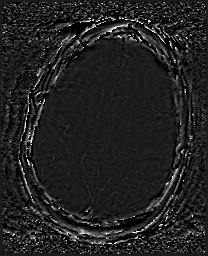
[im 60/60]
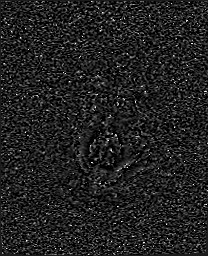

[Series 14: swi_images · axial · 3.0mm · 0.90mm/px · z∈[-106,+70]mm · 4 of 60 slices shown]
[im 1/60]
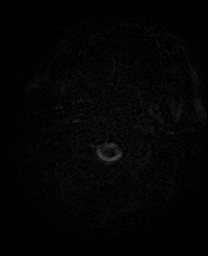
[im 20/60]
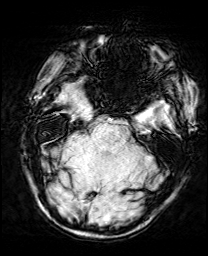
[im 40/60]
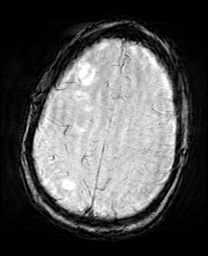
[im 60/60]
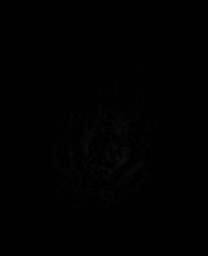

[Series 15: mip_images(sw) · axial · 24.0mm · 0.90mm/px · z∈[-95,+60]mm · 3 of 53 slices shown]
[im 1/53]
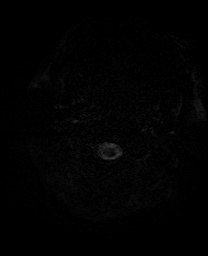
[im 27/53]
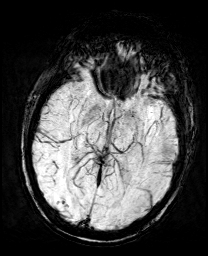
[im 53/53]
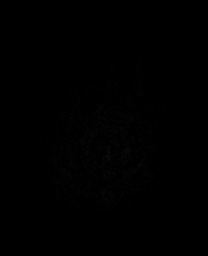

[Series 17: T2 · coronal · 5.0mm · 0.34mm/px · 2 of 29 slices shown (2 of 2)]
[im 1/29]
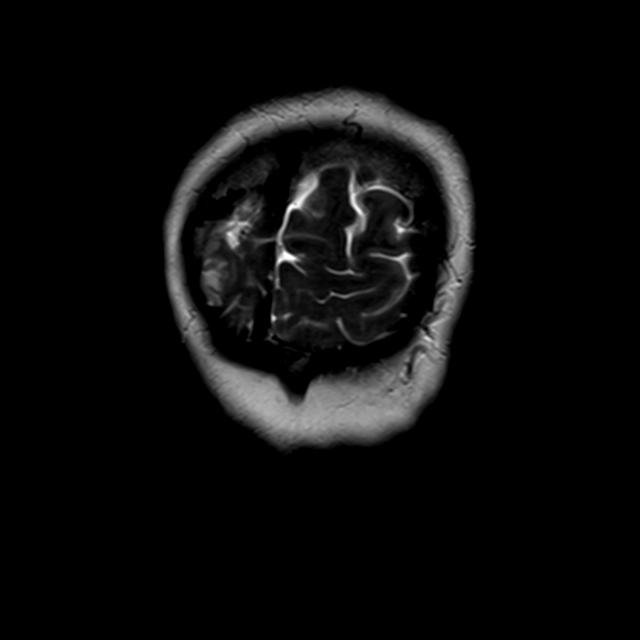
[im 29/29]
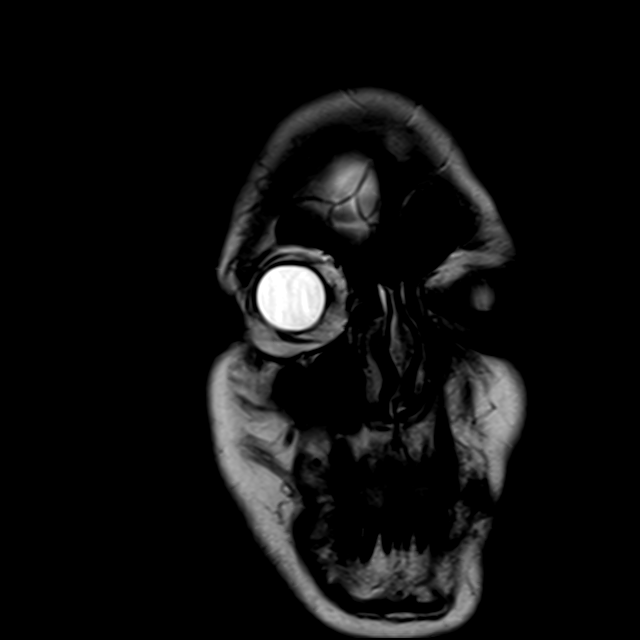

[37 of 48 positions shown; findings below may reference images not displayed]

FINDINGS: Brain:

Cerebral volume is normal for age.

There are patchy cortical/subcortical infarcts within the right
cerebral hemisphere within the right frontal, parietal, occipital
and temporal lobes as well as right insula. These acute infarcts are
present within the right MCA territory as well as MCA/PCA and
MCA/ACA watershed territories. These infarcts overall moderate in
volume. No significant mass effect at this time. There is mild SWI
signal loss and T1 hyperintensity associated with acute infarcts
within the right frontal and occipital lobes consistent with
petechial hemorrhage.

Background mild multifocal T2/FLAIR hyperintensity within the
cerebral white matter is nonspecific, but compatible with chronic
small vessel ischemic disease.

No evidence of intracranial mass.

No extra-axial fluid collection.

No midline shift.

Vascular: Expected proximal arterial flow voids.

Skull and upper cervical spine: No focal marrow lesion.

Sinuses/Orbits: Visualized orbits show no acute finding. No
significant paranasal sinus disease at the imaged levels.
IMPRESSION: Patchy acute cortical/subcortical infarcts within the right cerebral
hemisphere within the right MCA territory as well as MCA/PCA and
MCA/ACA watershed territories. The infarcts are overall moderate in
volume. No significant mass effect at this time. Petechial
hemorrhage associated with an acute infarcts in the right frontal
and right occipital lobes.

Background mild chronic small vessel ischemic disease.

## 2021-09-20 IMAGING — CT CT HEAD CODE STROKE
3 series · 15 of 47 positions shown, 18 images · non-contrast
Comparison: None.

CLINICAL DATA: Code stroke.  Left-sided weakness.

EXAM:
CT HEAD WITHOUT CONTRAST
TECHNIQUE: Contiguous axial images were obtained from the base of the skull
through the vertex without intravenous contrast.

[Series 2: head 5.0 st · axial · 0.44mm/px · z∈[-124,+16]mm · 9 of 34 slices shown, 12 images]
[im 3/34  brain]
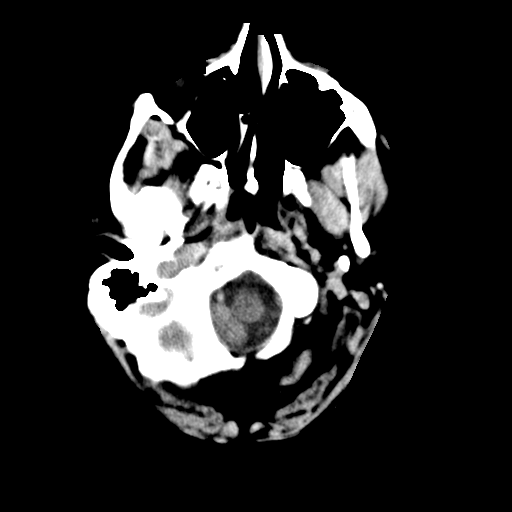
[im 3/34  bone]
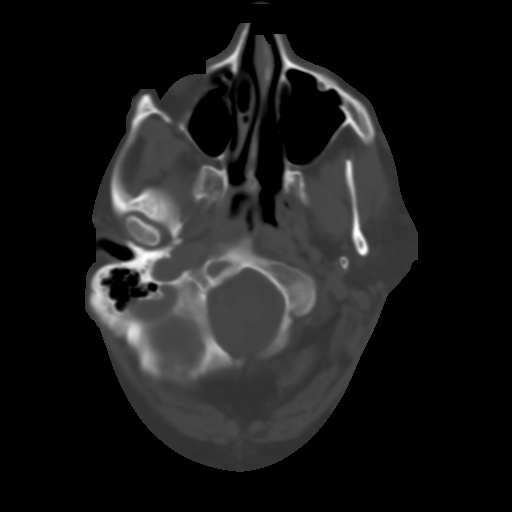
[im 6/34  brain]
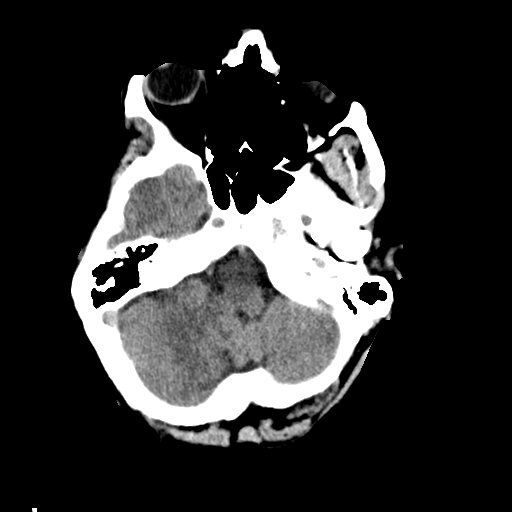
[im 10/34  brain]
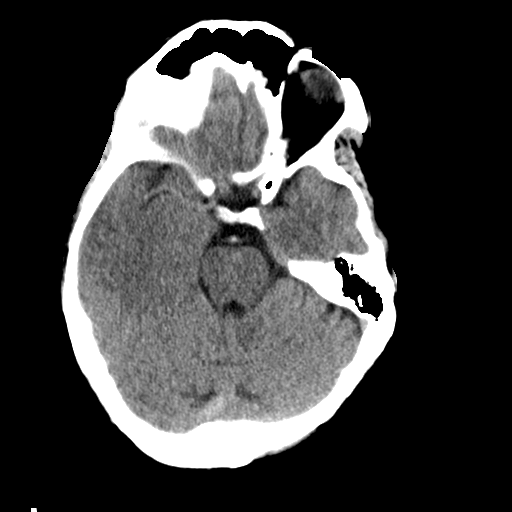
[im 13/34  brain]
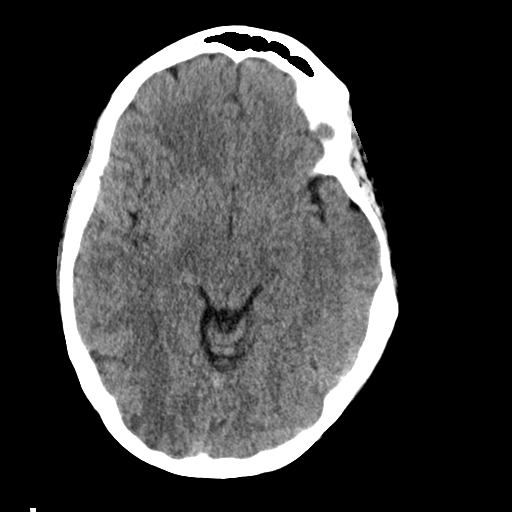
[im 18/34  brain]
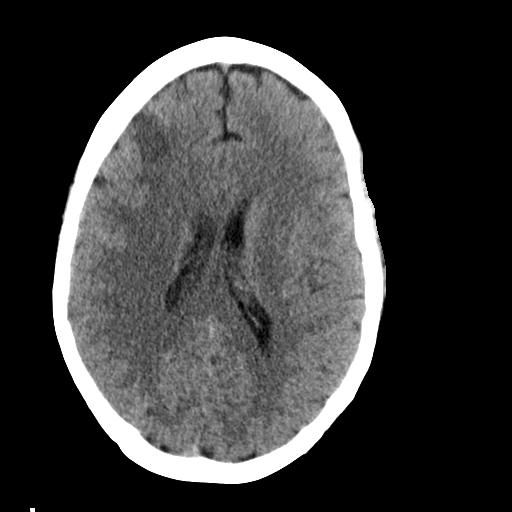
[im 18/34  bone]
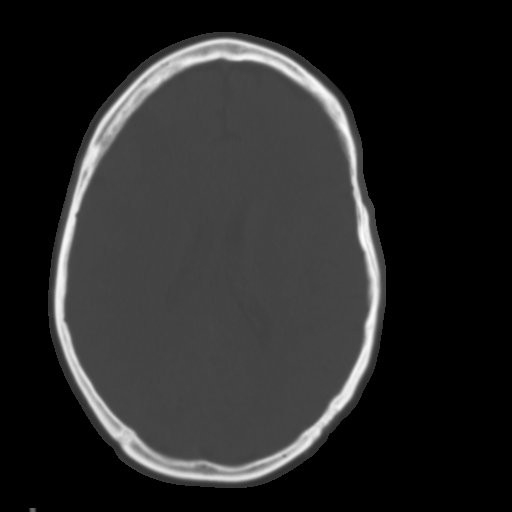
[im 21/34  brain]
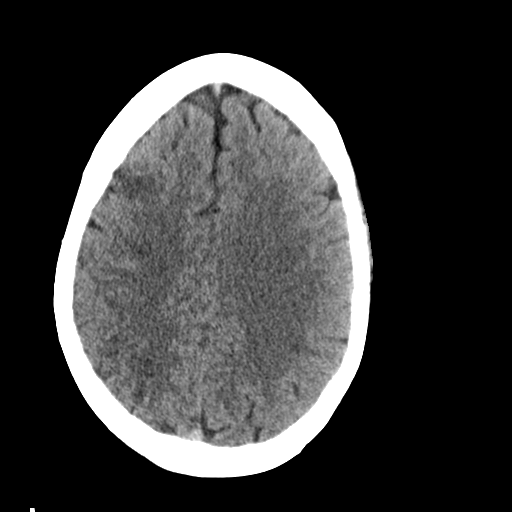
[im 24/34  brain]
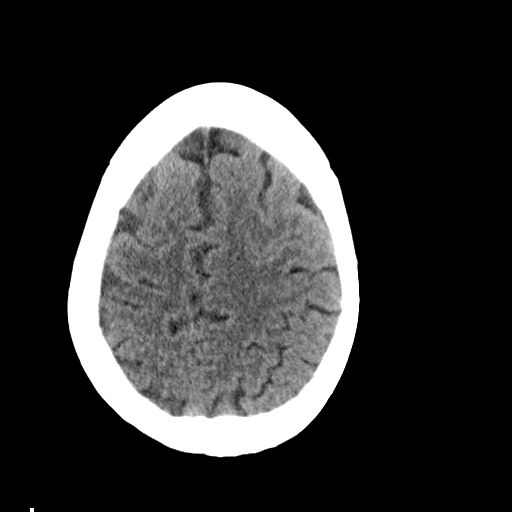
[im 28/34  brain]
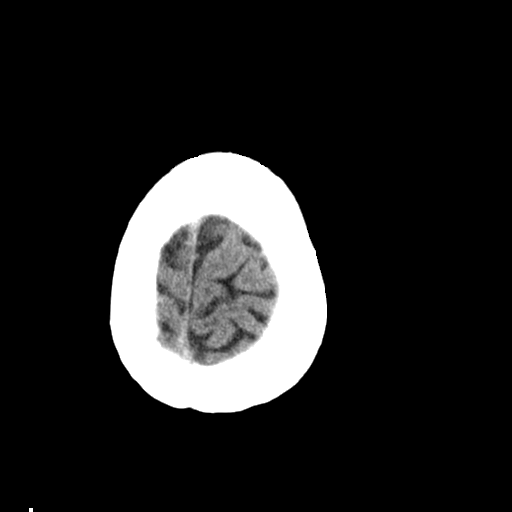
[im 31/34  brain]
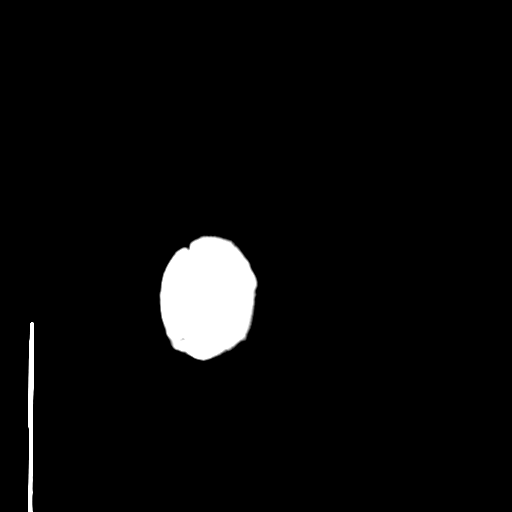
[im 31/34  bone]
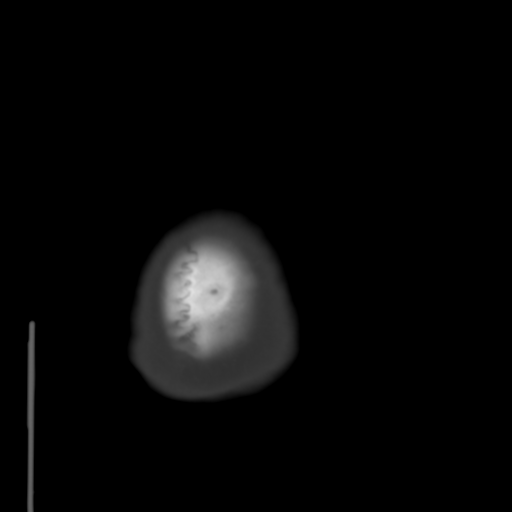

[Series 5: head 3.0 cor st · coronal · 0.33mm/px · 3 of 75 slices shown]
[im 25/75  brain]
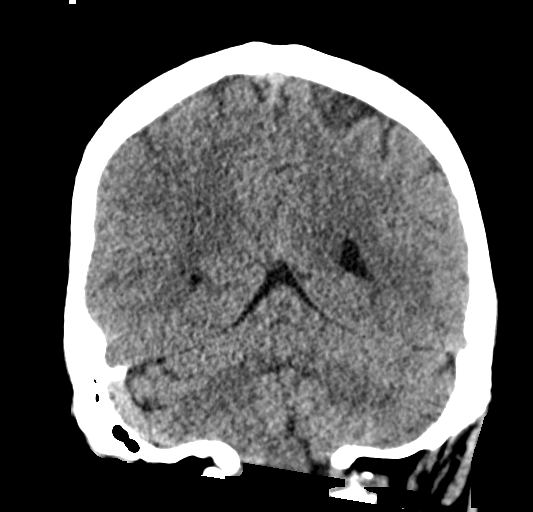
[im 33/75  brain]
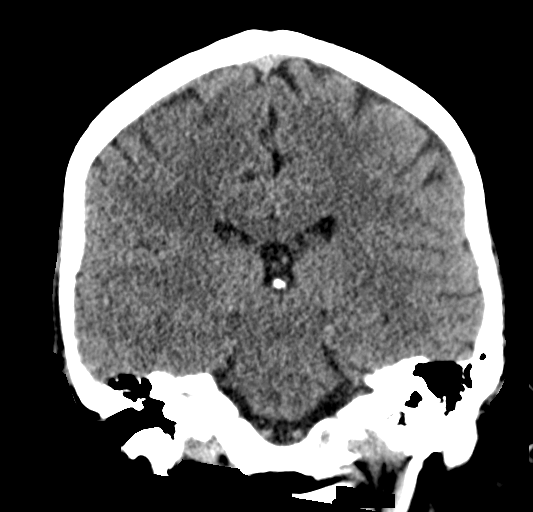
[im 42/75  brain]
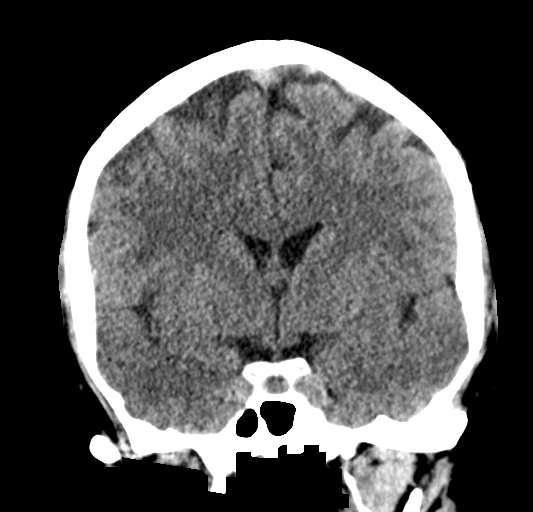

[Series 6: head 3.0 sag st · sagittal · 0.35mm/px · 3 of 60 slices shown]
[im 20/60  brain]
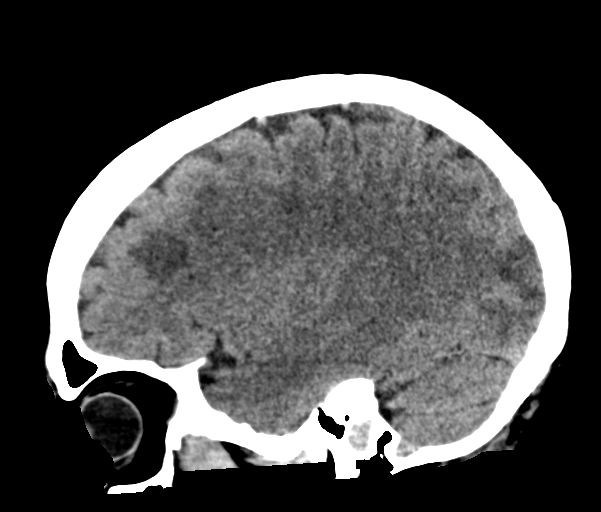
[im 30/60  brain]
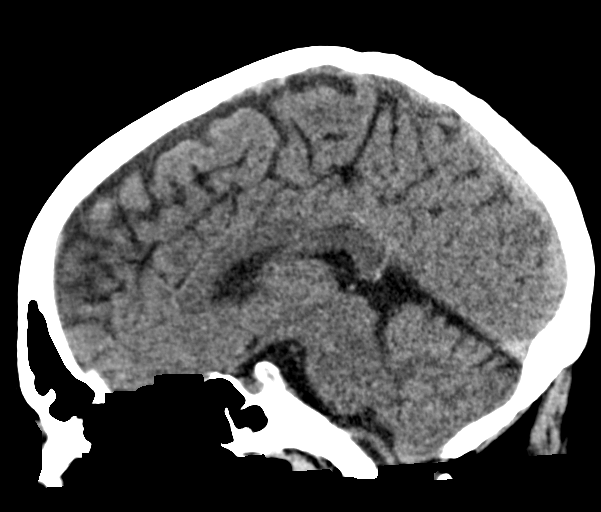
[im 40/60  brain]
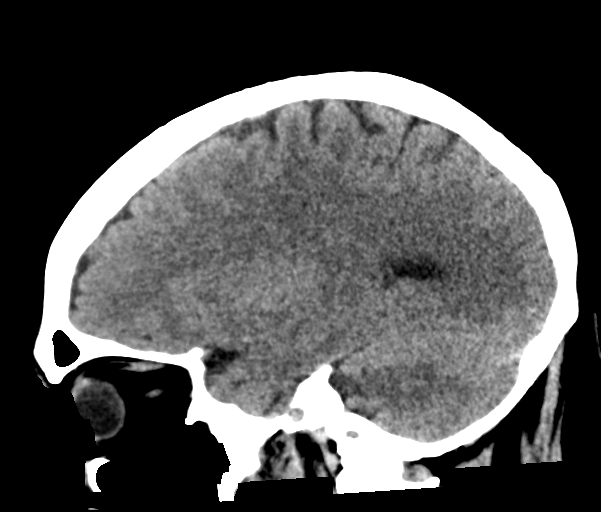

[15 of 47 positions shown; findings below may reference images not displayed]

FINDINGS: Brain: No abnormality affects the brainstem or cerebellum. The left
cerebral hemisphere is normal except for a questionable old white
matter infarction in the left parietal region. On the right, there
is evidence of acute infarction within the middle cerebral artery
territory. Cortical and subcortical low-density seen at the margins
of the MCA distribution, with diminished gray-white differentiation
elsewhere within the MCA territory. Minimal petechial blood products
in the area the right frontal portion of the infarction. No mass
effect. No hydrocephalus.

Vascular: No hyperdense vessel.

Skull: Normal

Sinuses/Orbits: Clear/normal

Other: None

ASPECTS (Alberta Stroke Program Early CT Score)

- Ganglionic level infarction (caudate, lentiform nuclei, internal
capsule, insula, M1-M3 cortex): 3

- Supraganglionic infarction (M4-M6 cortex): 0

Total score (0-10 with 10 being normal): 3
IMPRESSION: Acute infarction in the right middle cerebral artery territory.
Cortical and subcortical low-density at the margins of the
infarction, with diminished gray-white differentiation elsewhere
within the MCA territory. Minimal petechial blood products in the
area of the right frontal portion of the infarction. No mass effect.

Aspects score 3.

These results were communicated to Dr. Bonang Bonnie At [DATE] pmon
12/29/2020by text page via the [employer] messaging system.

## 2021-10-11 ENCOUNTER — Other Ambulatory Visit: Payer: Self-pay | Admitting: *Deleted

## 2021-10-11 MED ORDER — TOPIRAMATE 25 MG PO TABS: 50.0000 mg | ORAL_TABLET | Freq: Two times a day (BID) | ORAL | 1 refills | Status: DC

## 2021-12-03 ENCOUNTER — Encounter: Payer: Self-pay | Admitting: Neurology

## 2021-12-06 MED ORDER — TOPIRAMATE 25 MG PO TABS: 75.0000 mg | ORAL_TABLET | Freq: Two times a day (BID) | ORAL | 1 refills | Status: AC

## 2021-12-06 NOTE — Telephone Encounter (Signed)
We can certainly increase the dose further, I will authorize an increase  to 3 tablets, 75 mg bid po.  for 90 days and send to Dr Leonie Man for information. I noted that the last refill was authorized by Dr. Felecia Shelling.  Larey Seat, MD

## 2022-02-02 ENCOUNTER — Telehealth: Payer: Self-pay | Admitting: Neurology

## 2022-02-02 NOTE — Telephone Encounter (Signed)
With pt no longer living in [place], he is asking for the needed orders and he will have lab work done by his PCP in the areas where he now lives, please call. ?

## 2022-02-06 NOTE — Telephone Encounter (Signed)
I returned the call to the patient. He is going to have his PCP place the orders. Once resulted, he will have them faxed over to Dr. Clydene Fake attention.  ?

## 2022-02-22 ENCOUNTER — Encounter: Payer: Self-pay | Admitting: Neurology

## 2022-02-22 ENCOUNTER — Other Ambulatory Visit: Payer: Self-pay

## 2022-02-22 ENCOUNTER — Ambulatory Visit: Payer: BC Managed Care – PPO | Admitting: Neurology

## 2022-02-22 VITALS — BP 133/88 | HR 65 | Ht 73.0 in | Wt 204.5 lb

## 2022-02-22 DIAGNOSIS — Z8673 Personal history of transient ischemic attack (TIA), and cerebral infarction without residual deficits: Secondary | ICD-10-CM | POA: Diagnosis not present

## 2022-02-22 DIAGNOSIS — Z87898 Personal history of other specified conditions: Secondary | ICD-10-CM | POA: Diagnosis not present

## 2022-02-22 NOTE — Progress Notes (Signed)
?[facility] ?[address] ?[place]. [place] [address] ?[phone] ? ?     OFFICE FOLLOW-UP NOTE ? ?[name] ?Date of Birth:  01/25/1986 ?Medical Record Number:  [id number]  ? ?HPI: Antonio Rogers is a pleasant 36 year old Caucasian male seen today for initial office follow-up visit following hospital admission for stroke.  History is obtained from the patient and review of electronic medical records as well as care everywhere and I personally reviewed pertinent imaging films in PACS.  He has past medical history of hypertension, substance abuse, smoking, alcohol abuse who was admitted to [facility] on 12/29/2020 when his parents noted that he had difficult time texting and getting out of bed.  Patient is to live alone so exact last seen normal is not clear.  When EMS brought him to the ED he was a code stroke was not activated since patient was outside the time window for TPA.  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  Patient was taken for emergent mechanical thrombectomy which was successfully performed by Dr. Estanislado Pandy but no underlying dissection was noted.  Patient was kept in the intensive care unit and made rapid improvement only mild left hand weakness the time of discharge.  He had extensive stroke work-up including 2D echo which showed normal ejection fraction.  Urine drug screen was negative.  LDL cholesterol 50 mg percent.  Hemoglobin A1c was 5.5.  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  He had been a smoker and he quit smoking since his stroke.  He also quit alcohol and marijuana use as well.  Interestingly 2 weeks after his stroke patient her  developed severe abdominal pain and was admitted to [facility] in [place] with necrotizing pancreatitis.  CT scan of the abdomen and pelvis showed a complex heterogeneous mass with necrotic material in the form of cystic lesion in the lesser sac.  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  Cystogastrostomy was performed and a pancreatic duct stent was placed.  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  He was started on anticoagulation with Lovenox and his aspirin and Plavix were discontinued.  Patient also had arm infection and is currently has a PICC line and is being treated with antibiotics.  He has had a total of 4 ERCPs so far.  Patient states interestingly his brother died of an MI at age 56.  He denies any prior history of deep vein thrombosis or pulmonary embolism.  He did have a lower extremity venous Doppler during the previous stroke admission which was negative.  He states from stroke standpoint is doing well he still has mild left grip weakness and diminished fine motor skills but he has finished speech therapy and physical therapy is on hold because of his abdominal surgeries and is continuing occupational therapy. ?Update 08/24/2021 : He returns for follow-up after last visit 5 months ago.  Patient was started on Cerefolin for elevated homocystine and quit smoking and follow-up homocystine level came back normal at 12 at 03/08/2021 and MH TR DNA analysis for mutation was negative.  He remains on Cerefolin and is having trouble affording  it.  CT angiogram of the chest on 03/22/2021 showed no evidence of pulmonary AV fistula.  He was on Lovenox for his portal vein thrombosis but started develop some bleeding and it has been stopped a few months ago and is now on aspirin 81 mg daily.  CT abdomen with and without contrast on 07/03/2021 showed resolution of the cystic lesion in the body of the  pancreas and fluid collection in gallbladder representing resolving hematoma.  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  Patient has been referred to a hepatologist at [facility] and has an appointment next month.  He was seen by interventional radiologist at [facility] who felt he needed to go to a tertiary care center for further treatment.  Patient complains of left hand dysesthesias and burning which is intermittent.  He denies any lack of sensation.  He has not tried medications like gabapentin or Topamax for this.  He has been trying some tramadol which has not helped.  He has had no recurrent stroke or TIA symptoms. ?Update 02/22/2022 : He returns for follow-up after last visit 6 months ago.  He states Topamax seems to have helped his paresthesias significantly.  He is taking 75 mg twice daily tolerating them well without side effects.  He lost some initial weight with that stabilized.  He remains on aspirin tolerating well without bleeding or bruising.  He has not had any recurrent stroke or TIA symptoms.  He is tolerating Lipitor well without muscle aches and pains.  He continues follow-up with [facility] hepatology and was asked to come back next year.  He is scheduled to undergo colonoscopy next month.  Patient stopped taking Cerefolin 3 months ago as he could not afford it.  He will have follow-up homocystine level today to see if he needs any folic acid. ?ROS:   ?14 system review of systems is positive for weakness, stroke, abdominal pain, pancreatitis and all other systems negative ? ?PMH:  ?Past Medical History:  ?Diagnosis Date  ? Anxiety   ? Chicken pox   ? CVA (cerebral vascular accident) [facility])   ? Depression   ? Drug abuse and dependence ([place])   ? cocaine   ? Hypertension   ? high blood pressure readings  ? Pancreatitis   ? ? ?Social History:  ?Social History  ? ?Socioeconomic History  ? Marital  status: Legally Separated  ?  Spouse name: Not on file  ? Number of children: 0  ? Years of education: college  ? Highest education level: Bachelor's degree (e.g., BA, AB, BS)  ?Occupational History  ? Occupation: Disability  ?Tobacco Use  ? Smoking status: Former  ?  Packs/day: 1.00  ?  Types: Cigarettes  ? Smokeless tobacco: Never  ? Tobacco comments:  ?  No smoking since 12/29/20.  ?Substance and Sexual Activity  ? Alcohol use: Yes  ?  Alcohol/week: 0.0 standard drinks  ?  Comment: No drinking since 12/29/20.  ? Drug use: Not Currently  ?  Comment: No drug use since 12/29/20.  ? Sexual activity: Not on file  ?Other Topics Concern  ? Not on file  ?Social History Narrative  ? Lives at home alone.  ? Right-handed.  ? No daily caffeine use.  ? ?Social Determinants of Health  ? ?Financial Resource Strain: Not on file  ?Food Insecurity: Not on file  ?Transportation Needs: Not on file  ?Physical Activity: Not on  file  ?Stress: Not on file  ?Social Connections: Not on file  ?Intimate Partner Violence: Not on file  ? ? ?Medications:   ?Current Outpatient Medications on File Prior to Visit  ?Medication Sig Dispense Refill  ? aspirin 81 MG EC tablet Take by mouth.    ? atorvastatin (LIPITOR) 20 MG tablet Take 1 tablet (20 mg total) by mouth daily. 30 tablet 1  ? cariprazine (VRAYLAR) 3 MG capsule Take by mouth.    ? cholecalciferol (VITAMIN D) 25 MCG tablet Take 1 tablet (1,000 Units total) by mouth daily. 30 tablet 0  ? hydrOXYzine (ATARAX/VISTARIL) 50 MG tablet Take 100 mg by mouth daily.  2  ? meropenem (MERREM) IVPB Inject 1 g into the vein every 8 (eight) hours.    ? pantoprazole (PROTONIX) 40 MG tablet Take 40 mg by mouth daily.    ? topiramate (TOPAMAX) 25 MG tablet Take 3 tablets (75 mg total) by mouth 2 (two) times daily. 540 tablet 1  ? traZODone (DESYREL) 100 MG tablet Take 50-100 mg by mouth at bedtime.    ? venlafaxine XR (EFFEXOR-XR) 75 MG 24 hr capsule Take 150 mg by mouth daily.    ? vitamin C (ASCORBIC ACID)  500 MG tablet Take 500 mg by mouth daily.    ? ?Current Facility-Administered Medications on File Prior to Visit  ?Medication Dose Route Frequency Provider Last Rate Last Admin  ? folic acid (FOLVITE) tablet 1 mg

## 2022-02-22 NOTE — Patient Instructions (Signed)
had a long discussion with the patient with his remote cryptogenic stroke and he seems to be doing quite well from that but does complain of some left upper extremity paresthesias and dysesthesias likely poststroke.  I recommend he continue Topamax 25 mg twice daily since it seems to be helping him and is tolerating it well..  I also recommend we check homocystine level today and if normal he does not need any further checking in the future.  If elevated we can try folic acid 1 mg as he had trouble affording Cerefolin.  He will continue aspirin for stroke prevention and maintain aggressive risk factor modification with strict control of hypertension with blood pressure goal below 140/90, lipids with LDL cholesterol goal below 70 mg percent and diabetes with hemoglobin A1c goal below 6.5%.  He will continue follow-up at [facility] hepatology for his portal vein thrombosis.  No schedule follow-up return visit with me is necessary as patient lives in [place] which is a 2-hour drive.  He can return for follow-up only if needed. ?

## 2022-02-23 LAB — HOMOCYSTEINE: Homocysteine: 23.1 umol/L — ABNORMAL HIGH (ref 0.0–14.5)

## 2022-02-28 ENCOUNTER — Other Ambulatory Visit: Payer: Self-pay | Admitting: Neurology

## 2022-02-28 MED ORDER — FOLIC ACID 1 MG PO TABS: 1.0000 mg | ORAL_TABLET | Freq: Every day | ORAL | 3 refills | Status: AC

## 2022-03-01 ENCOUNTER — Telehealth: Payer: Self-pay

## 2022-03-01 NOTE — Telephone Encounter (Signed)
I called pt and left a vm with results for Dr. Leonie Man Your homocystine level is high.  Need to start taking folic acid 1 mg daily and I will send a prescription to listed pharmacy.  May need to repeat level in 2 to 3 months ? ?Pt advised to call back if he had questions.  ?

## 2022-05-15 ENCOUNTER — Encounter: Payer: Self-pay | Admitting: Neurology

## 2022-08-07 ENCOUNTER — Other Ambulatory Visit: Payer: Self-pay | Admitting: Neurology
